# Patient Record
Sex: Female | Born: 1960
Health system: Southern US, Community
[De-identification: ages and names within clinical notes are randomized; demographics above are authoritative.]

## PROBLEM LIST (undated history)

## (undated) DIAGNOSIS — K219 Gastro-esophageal reflux disease without esophagitis: Secondary | ICD-10-CM

## (undated) DIAGNOSIS — J302 Other seasonal allergic rhinitis: Secondary | ICD-10-CM

## (undated) HISTORY — PX: TUBAL LIGATION: SHX77

## (undated) HISTORY — PX: COLONOSCOPY: SHX174

---

## 2005-04-17 ENCOUNTER — Ambulatory Visit: Payer: Self-pay | Admitting: Unknown Physician Specialty

## 2006-06-26 ENCOUNTER — Ambulatory Visit: Payer: Self-pay | Admitting: Unknown Physician Specialty

## 2010-12-07 ENCOUNTER — Ambulatory Visit: Payer: Self-pay | Admitting: Unknown Physician Specialty

## 2012-01-11 ENCOUNTER — Other Ambulatory Visit (HOSPITAL_COMMUNITY)
Admission: RE | Admit: 2012-01-11 | Discharge: 2012-01-11 | Disposition: A | Discharge status: Home / Self Care | Payer: 59 | Source: Ambulatory Visit | Attending: Family Medicine | Admitting: Family Medicine

## 2012-01-11 DIAGNOSIS — Z0000 Encounter for general adult medical examination without abnormal findings: Secondary | ICD-10-CM | POA: Insufficient documentation

## 2012-07-26 ENCOUNTER — Other Ambulatory Visit: Payer: Self-pay | Admitting: Obstetrics and Gynecology

## 2012-07-26 DIAGNOSIS — Z1231 Encounter for screening mammogram for malignant neoplasm of breast: Secondary | ICD-10-CM

## 2012-08-20 ENCOUNTER — Ambulatory Visit
Admission: RE | Admit: 2012-08-20 | Discharge: 2012-08-20 | Disposition: A | Discharge status: Home / Self Care | Payer: 59 | Source: Ambulatory Visit | Attending: Obstetrics and Gynecology | Admitting: Obstetrics and Gynecology

## 2012-08-20 DIAGNOSIS — Z1231 Encounter for screening mammogram for malignant neoplasm of breast: Secondary | ICD-10-CM

## 2012-08-29 ENCOUNTER — Other Ambulatory Visit: Payer: Self-pay | Admitting: Obstetrics and Gynecology

## 2012-08-29 DIAGNOSIS — R928 Other abnormal and inconclusive findings on diagnostic imaging of breast: Secondary | ICD-10-CM

## 2012-09-11 HISTORY — PX: ABLATION: SHX5711

## 2012-09-12 ENCOUNTER — Ambulatory Visit
Admission: RE | Admit: 2012-09-12 | Discharge: 2012-09-12 | Disposition: A | Discharge status: Home / Self Care | Payer: 59 | Source: Ambulatory Visit | Attending: Obstetrics and Gynecology | Admitting: Obstetrics and Gynecology

## 2012-09-12 DIAGNOSIS — R928 Other abnormal and inconclusive findings on diagnostic imaging of breast: Secondary | ICD-10-CM

## 2013-01-15 ENCOUNTER — Other Ambulatory Visit (HOSPITAL_COMMUNITY)
Admission: RE | Admit: 2013-01-15 | Discharge: 2013-01-15 | Disposition: A | Discharge status: Home / Self Care | Payer: 59 | Source: Ambulatory Visit | Attending: Obstetrics and Gynecology | Admitting: Obstetrics and Gynecology

## 2013-01-15 ENCOUNTER — Other Ambulatory Visit: Payer: Self-pay | Admitting: Obstetrics and Gynecology

## 2013-01-15 DIAGNOSIS — Z1151 Encounter for screening for human papillomavirus (HPV): Secondary | ICD-10-CM | POA: Insufficient documentation

## 2013-01-15 DIAGNOSIS — Z01419 Encounter for gynecological examination (general) (routine) without abnormal findings: Secondary | ICD-10-CM | POA: Insufficient documentation

## 2014-12-14 ENCOUNTER — Other Ambulatory Visit: Payer: Self-pay

## 2014-12-14 DIAGNOSIS — Z1231 Encounter for screening mammogram for malignant neoplasm of breast: Secondary | ICD-10-CM

## 2014-12-22 ENCOUNTER — Ambulatory Visit
Admission: RE | Admit: 2014-12-22 | Discharge: 2014-12-22 | Disposition: A | Discharge status: Home / Self Care | Payer: 59 | Source: Ambulatory Visit

## 2014-12-22 DIAGNOSIS — Z1231 Encounter for screening mammogram for malignant neoplasm of breast: Secondary | ICD-10-CM

## 2015-01-27 ENCOUNTER — Other Ambulatory Visit: Payer: Self-pay | Admitting: Obstetrics and Gynecology

## 2015-01-27 ENCOUNTER — Other Ambulatory Visit (HOSPITAL_COMMUNITY)
Admission: RE | Admit: 2015-01-27 | Discharge: 2015-01-27 | Disposition: A | Discharge status: Home / Self Care | Payer: 59 | Source: Ambulatory Visit | Attending: Obstetrics and Gynecology | Admitting: Obstetrics and Gynecology

## 2015-01-27 DIAGNOSIS — Z01419 Encounter for gynecological examination (general) (routine) without abnormal findings: Secondary | ICD-10-CM | POA: Diagnosis not present

## 2015-01-28 LAB — CYTOLOGY - PAP

## 2016-01-27 ENCOUNTER — Other Ambulatory Visit: Payer: Self-pay | Admitting: Obstetrics and Gynecology

## 2016-01-27 ENCOUNTER — Other Ambulatory Visit: Payer: Self-pay

## 2016-01-27 ENCOUNTER — Other Ambulatory Visit (HOSPITAL_COMMUNITY)
Admission: RE | Admit: 2016-01-27 | Discharge: 2016-01-27 | Disposition: A | Discharge status: Home / Self Care | Payer: 59 | Source: Ambulatory Visit | Attending: Obstetrics and Gynecology | Admitting: Obstetrics and Gynecology

## 2016-01-27 DIAGNOSIS — Z1151 Encounter for screening for human papillomavirus (HPV): Secondary | ICD-10-CM | POA: Diagnosis present

## 2016-01-27 DIAGNOSIS — R87810 Cervical high risk human papillomavirus (HPV) DNA test positive: Secondary | ICD-10-CM | POA: Diagnosis not present

## 2016-01-27 DIAGNOSIS — Z1231 Encounter for screening mammogram for malignant neoplasm of breast: Secondary | ICD-10-CM

## 2016-01-27 DIAGNOSIS — Z01419 Encounter for gynecological examination (general) (routine) without abnormal findings: Secondary | ICD-10-CM | POA: Insufficient documentation

## 2016-01-28 LAB — CYTOLOGY - PAP

## 2016-01-31 ENCOUNTER — Ambulatory Visit
Admission: RE | Admit: 2016-01-31 | Discharge: 2016-01-31 | Disposition: A | Discharge status: Home / Self Care | Payer: 59 | Source: Ambulatory Visit

## 2016-01-31 DIAGNOSIS — Z1231 Encounter for screening mammogram for malignant neoplasm of breast: Secondary | ICD-10-CM

## 2016-08-09 ENCOUNTER — Other Ambulatory Visit: Payer: Self-pay | Admitting: Orthopedic Surgery

## 2016-08-17 ENCOUNTER — Ambulatory Visit (HOSPITAL_BASED_OUTPATIENT_CLINIC_OR_DEPARTMENT_OTHER): Admission: RE | Admit: 2016-08-17 | Payer: 59 | Source: Ambulatory Visit | Admitting: Orthopedic Surgery

## 2016-08-17 ENCOUNTER — Encounter (HOSPITAL_BASED_OUTPATIENT_CLINIC_OR_DEPARTMENT_OTHER): Admission: RE | Payer: Self-pay | Source: Ambulatory Visit

## 2016-08-17 SURGERY — RELEASE, A1 PULLEY, FOR TRIGGER FINGER
Anesthesia: Choice | Laterality: Right

## 2017-01-29 ENCOUNTER — Other Ambulatory Visit (HOSPITAL_COMMUNITY)
Admission: RE | Admit: 2017-01-29 | Discharge: 2017-01-29 | Disposition: A | Discharge status: Home / Self Care | Payer: 59 | Source: Ambulatory Visit | Attending: Obstetrics and Gynecology | Admitting: Obstetrics and Gynecology

## 2017-01-29 ENCOUNTER — Other Ambulatory Visit: Payer: Self-pay | Admitting: Obstetrics and Gynecology

## 2017-01-29 DIAGNOSIS — Z124 Encounter for screening for malignant neoplasm of cervix: Secondary | ICD-10-CM | POA: Diagnosis not present

## 2017-01-29 DIAGNOSIS — R87810 Cervical high risk human papillomavirus (HPV) DNA test positive: Secondary | ICD-10-CM | POA: Insufficient documentation

## 2017-01-30 LAB — CYTOLOGY - PAP
Diagnosis: NEGATIVE
HPV (WINDOPATH): DETECTED — AB
HPV 16/18/45 genotyping: NEGATIVE

## 2017-02-28 ENCOUNTER — Other Ambulatory Visit: Payer: Self-pay | Admitting: Obstetrics and Gynecology

## 2017-03-06 ENCOUNTER — Other Ambulatory Visit: Payer: Self-pay | Admitting: Obstetrics and Gynecology

## 2017-03-06 DIAGNOSIS — Z1231 Encounter for screening mammogram for malignant neoplasm of breast: Secondary | ICD-10-CM

## 2017-03-16 ENCOUNTER — Ambulatory Visit: Payer: 59

## 2017-03-19 ENCOUNTER — Ambulatory Visit
Admission: RE | Admit: 2017-03-19 | Discharge: 2017-03-19 | Disposition: A | Discharge status: Home / Self Care | Payer: 59 | Source: Ambulatory Visit | Attending: Obstetrics and Gynecology | Admitting: Obstetrics and Gynecology

## 2017-03-19 DIAGNOSIS — Z1231 Encounter for screening mammogram for malignant neoplasm of breast: Secondary | ICD-10-CM

## 2017-05-07 ENCOUNTER — Encounter (HOSPITAL_COMMUNITY): Payer: Self-pay | Admitting: *Deleted

## 2017-05-23 ENCOUNTER — Encounter (HOSPITAL_COMMUNITY): Payer: Self-pay

## 2017-05-23 ENCOUNTER — Ambulatory Visit (HOSPITAL_COMMUNITY): Payer: 59 | Admitting: Anesthesiology

## 2017-05-23 ENCOUNTER — Encounter (HOSPITAL_COMMUNITY)
Admission: RE | Disposition: A | Discharge status: Home / Self Care | Payer: Self-pay | Source: Ambulatory Visit | Attending: Obstetrics and Gynecology

## 2017-05-23 ENCOUNTER — Ambulatory Visit (HOSPITAL_COMMUNITY)
Admission: RE | Admit: 2017-05-23 | Discharge: 2017-05-23 | Disposition: A | Discharge status: Home / Self Care | Payer: 59 | Source: Ambulatory Visit | Attending: Obstetrics and Gynecology | Admitting: Obstetrics and Gynecology

## 2017-05-23 DIAGNOSIS — D060 Carcinoma in situ of endocervix: Secondary | ICD-10-CM | POA: Insufficient documentation

## 2017-05-23 DIAGNOSIS — N871 Moderate cervical dysplasia: Secondary | ICD-10-CM | POA: Diagnosis present

## 2017-05-23 DIAGNOSIS — Z79899 Other long term (current) drug therapy: Secondary | ICD-10-CM | POA: Insufficient documentation

## 2017-05-23 DIAGNOSIS — Z9851 Tubal ligation status: Secondary | ICD-10-CM | POA: Diagnosis not present

## 2017-05-23 HISTORY — PX: CERVICAL CONIZATION W/BX: SHX1330

## 2017-05-23 LAB — CBC
HCT: 43.6 % (ref 36.0–46.0)
Hemoglobin: 14.7 g/dL (ref 12.0–15.0)
MCH: 30 pg (ref 26.0–34.0)
MCHC: 33.7 g/dL (ref 30.0–36.0)
MCV: 89 fL (ref 78.0–100.0)
PLATELETS: 280 10*3/uL (ref 150–400)
RBC: 4.9 MIL/uL (ref 3.87–5.11)
RDW: 13.2 % (ref 11.5–15.5)
WBC: 8.1 10*3/uL (ref 4.0–10.5)

## 2017-05-23 SURGERY — CONE BIOPSY, CERVIX
Anesthesia: Monitor Anesthesia Care | Site: Vagina

## 2017-05-23 MED ORDER — DEXAMETHASONE SODIUM PHOSPHATE 10 MG/ML IJ SOLN
INTRAMUSCULAR | Status: DC | PRN
  Administered 2017-05-23: 4 mg via INTRAVENOUS

## 2017-05-23 MED ORDER — LIDOCAINE HCL (CARDIAC) 20 MG/ML IV SOLN
INTRAVENOUS | Status: DC | PRN
  Administered 2017-05-23: 50 mg via INTRAVENOUS

## 2017-05-23 MED ORDER — LIDOCAINE-EPINEPHRINE 1 %-1:100000 IJ SOLN
INTRAMUSCULAR | Status: DC | PRN
  Administered 2017-05-23: 20 mL

## 2017-05-23 MED ORDER — HYDROCODONE-ACETAMINOPHEN 5-325 MG PO TABS: 1.0000 | ORAL_TABLET | Freq: Four times a day (QID) | ORAL | 0 refills | Status: DC | PRN

## 2017-05-23 MED ORDER — SCOPOLAMINE 1 MG/3DAYS TD PT72
MEDICATED_PATCH | TRANSDERMAL | Status: AC
  Filled 2017-05-23: qty 1

## 2017-05-23 MED ORDER — LIDOCAINE HCL (CARDIAC) 20 MG/ML IV SOLN
INTRAVENOUS | Status: AC
  Filled 2017-05-23: qty 5

## 2017-05-23 MED ORDER — IBUPROFEN 800 MG PO TABS: 800.0000 mg | ORAL_TABLET | Freq: Three times a day (TID) | ORAL | 0 refills | Status: DC | PRN

## 2017-05-23 MED ORDER — IODINE STRONG (LUGOLS) 5 % PO SOLN
ORAL | Status: DC | PRN
  Administered 2017-05-23: 0.2 mL

## 2017-05-23 MED ORDER — ONDANSETRON HCL 4 MG/2ML IJ SOLN
INTRAMUSCULAR | Status: AC
  Filled 2017-05-23: qty 2

## 2017-05-23 MED ORDER — FENTANYL CITRATE (PF) 100 MCG/2ML IJ SOLN
INTRAMUSCULAR | Status: DC | PRN
  Administered 2017-05-23: 100 ug via INTRAVENOUS

## 2017-05-23 MED ORDER — IODINE STRONG (LUGOLS) 5 % PO SOLN
ORAL | Status: AC
  Filled 2017-05-23: qty 1

## 2017-05-23 MED ORDER — LACTATED RINGERS IV SOLN
INTRAVENOUS | Status: DC
  Administered 2017-05-23 (×2): via INTRAVENOUS

## 2017-05-23 MED ORDER — PROPOFOL 10 MG/ML IV BOLUS
INTRAVENOUS | Status: AC
  Filled 2017-05-23: qty 20

## 2017-05-23 MED ORDER — PROPOFOL 10 MG/ML IV BOLUS
INTRAVENOUS | Status: DC | PRN
  Administered 2017-05-23 (×2): 50 mg via INTRAVENOUS
  Administered 2017-05-23: 20 mg via INTRAVENOUS
  Administered 2017-05-23: 30 mg via INTRAVENOUS
  Administered 2017-05-23 (×2): 50 mg via INTRAVENOUS

## 2017-05-23 MED ORDER — FENTANYL CITRATE (PF) 250 MCG/5ML IJ SOLN
INTRAMUSCULAR | Status: AC
  Filled 2017-05-23: qty 5

## 2017-05-23 MED ORDER — MIDAZOLAM HCL 2 MG/2ML IJ SOLN
INTRAMUSCULAR | Status: AC
  Filled 2017-05-23: qty 2

## 2017-05-23 MED ORDER — LIDOCAINE-EPINEPHRINE 1 %-1:100000 IJ SOLN
INTRAMUSCULAR | Status: AC
  Filled 2017-05-23: qty 1

## 2017-05-23 MED ORDER — MIDAZOLAM HCL 2 MG/2ML IJ SOLN
INTRAMUSCULAR | Status: DC | PRN
  Administered 2017-05-23: 2 mg via INTRAVENOUS

## 2017-05-23 MED ORDER — SCOPOLAMINE 1 MG/3DAYS TD PT72
1.0000 | MEDICATED_PATCH | Freq: Once | TRANSDERMAL | Status: DC
  Administered 2017-05-23: 1.5 mg via TRANSDERMAL

## 2017-05-23 MED ORDER — ONDANSETRON HCL 4 MG/2ML IJ SOLN
INTRAMUSCULAR | Status: DC | PRN
  Administered 2017-05-23: 4 mg via INTRAVENOUS

## 2017-05-23 MED ORDER — FERRIC SUBSULFATE 259 MG/GM EX SOLN
CUTANEOUS | Status: AC
  Filled 2017-05-23: qty 8

## 2017-05-23 MED ORDER — DEXAMETHASONE SODIUM PHOSPHATE 4 MG/ML IJ SOLN
INTRAMUSCULAR | Status: AC
Start: 2017-05-23 — End: ?
  Filled 2017-05-23: qty 1

## 2017-05-23 MED ORDER — ACETIC ACID 5 % SOLN
Status: AC
  Filled 2017-05-23: qty 500

## 2017-05-23 MED ORDER — KETOROLAC TROMETHAMINE 30 MG/ML IJ SOLN
INTRAMUSCULAR | Status: AC
  Filled 2017-05-23: qty 1

## 2017-05-23 MED ORDER — FERRIC SUBSULFATE SOLN
Status: DC | PRN
  Administered 2017-05-23: 1 via TOPICAL

## 2017-05-23 SURGICAL SUPPLY — 27 items
BLADE SURG 11 STRL SS (BLADE) ×2 IMPLANT
CATH ROBINSON RED A/P 16FR (CATHETERS) IMPLANT
CLOTH BEACON ORANGE TIMEOUT ST (SAFETY) ×2 IMPLANT
CONTAINER PREFILL 10% NBF 60ML (FORM) ×2 IMPLANT
COUNTER NEEDLE 1200 MAGNETIC (NEEDLE) ×2 IMPLANT
ELECT BALL LEEP 5MM RED (ELECTRODE) ×2 IMPLANT
ELECT REM PT RETURN 9FT ADLT (ELECTROSURGICAL) ×2
ELECTRODE REM PT RTRN 9FT ADLT (ELECTROSURGICAL) ×1 IMPLANT
GLOVE BIOGEL M 6.5 STRL (GLOVE) ×2 IMPLANT
GLOVE BIOGEL PI IND STRL 6.5 (GLOVE) ×1 IMPLANT
GLOVE BIOGEL PI IND STRL 7.0 (GLOVE) ×1 IMPLANT
GLOVE BIOGEL PI INDICATOR 6.5 (GLOVE) ×1
GLOVE BIOGEL PI INDICATOR 7.0 (GLOVE) ×1
GOWN STRL REUS W/TWL LRG LVL3 (GOWN DISPOSABLE) ×4 IMPLANT
HEMOSTAT SURGICEL 4X8 (HEMOSTASIS) ×2 IMPLANT
NS IRRIG 1000ML POUR BTL (IV SOLUTION) ×2 IMPLANT
PACK VAGINAL MINOR WOMEN LF (CUSTOM PROCEDURE TRAY) ×2 IMPLANT
PAD OB MATERNITY 4.3X12.25 (PERSONAL CARE ITEMS) ×2 IMPLANT
PENCIL BUTTON HOLSTER BLD 10FT (ELECTRODE) ×2 IMPLANT
SCOPETTES 8  STERILE (MISCELLANEOUS) ×1
SCOPETTES 8 STERILE (MISCELLANEOUS) ×1 IMPLANT
SPONGE SURGIFOAM ABS GEL 12-7 (HEMOSTASIS) IMPLANT
SUT VIC AB 0 CT1 27 (SUTURE) ×3
SUT VIC AB 0 CT1 27XBRD ANBCTR (SUTURE) ×3 IMPLANT
TOWEL OR 17X24 6PK STRL BLUE (TOWEL DISPOSABLE) ×4 IMPLANT
TUBING NON-CON 1/4 X 20 CONN (TUBING) ×2 IMPLANT
YANKAUER SUCT BULB TIP NO VENT (SUCTIONS) ×2 IMPLANT

## 2017-05-23 NOTE — Anesthesia Postprocedure Evaluation (Signed)
Anesthesia Post Note  Patient: Charlotte Ramirez  Procedure(s) Performed: Procedure(s) (LRB): CONIZATION CERVIX WITH BIOPSY (N/A)     Patient location during evaluation: PACU Anesthesia Type: MAC Level of consciousness: awake and alert Pain management: pain level controlled Vital Signs Assessment: post-procedure vital signs reviewed and stable Respiratory status: spontaneous breathing, nonlabored ventilation, respiratory function stable and patient connected to nasal cannula oxygen Cardiovascular status: stable and blood pressure returned to baseline Anesthetic complications: no    Last Vitals:  Vitals:   05/23/17 1617 05/23/17 1630  BP: 109/64 (!) 103/91  Pulse: 78 73  Resp: (!) 22 (!) 21  Temp: (!) 36.3 C   SpO2: 98% 100%    Last Pain:  Vitals:   05/23/17 1630  TempSrc:   PainSc: 0-No pain   Pain Goal: Patients Stated Pain Goal: 3 (05/23/17 1630)               Tiajuana Amass

## 2017-05-23 NOTE — H&P (Signed)
Chief Complaint(s):   High grade cervical dysplasia   HPI:  General 56 y/o presents for preop history and physical exam in preparation for Cold knife conization. Colposocpy results from 02/28/2017 [facility] CIN II biopsy at 12 oclock and 6 oclock CIN I. she had high risk hpv detected on pap smear in 2017 and 2018. Her pap smears in 2017 and 2018 were negative for intraepithelial lesions.  Current Medication:  Taking  Vitamin B12 100 MCG Tablet 1 tablet Orally Once a day     Cranberry 300 MG Tablet as directed Orally     Calcium 600 MG Tablet 2 tablets Orally Once a day     MVI 1 tab Oral     Vitamin D 1000 UNIT Tablet 1 tablet Orally Once a day     Biotin 300 MCG Tablet 1 tablet Orally Once a day     Zyrtec     Fish Oil 1000 MG Capsule 1 capsule Orally Once a day     Prevacid(Lansoprazole) 15 MG Capsule Delayed Release 1 capsule Orally Once a day     Medication List reviewed and reconciled with the patient   Medical History:   complex R ovarian cyst - 2013      Allergies/Intolerance:   N.K.D.A.   Gyn History:   Sexual activity currently sexually active. LMP 04/24/12. Birth control BTL, Novasure Ablation. Last pap smear date 01/29/17 - WNL high risk HPV (negative 16/18) 01/27/2016 Positive HPV. Last mammogram date 03/19/2017. Abnormal pap smear none. Denies STD none. GYN procedures 02/28/17 - Colposcopy 05/08/12 Novasure Ablation.   OB History:   Number of pregnancies 3. Pregnancy # 1 01/1985 , live birth, vaginal delivery, girl. Pregnancy # 2 01/1992 , live birth, vaginal delivery, boy. Pregnancy # 3 03/1994 , live birth, vaginal delivery, girl.   Surgical History:   tubal ligation     NovaSure Ablation in OBGYN office 05/08/12   Hospitalization:   child birth x 3   Family History:   Father: alive 26 yrs, health    Mother: alive 74 yrs, hypertension, diagnosed with Hypertension    Paternal Grand Father: deceased    Paternal Grand Mother: deceased, osteoporosis    Maternal Grand  Father: deceased    Maternal Grand Mother: deceased, breast cancer, diagnosed with Breast cancer    Brother 1: alive 47 yrs, obese    Sister 1: alive 89 yrs, healthy    Sister 2: alive 15 yrs, healthy    Sister 3: alive 21 yrs, healthy    Paternal aunt: deceased, Breast cancer, diagnosed with Breast cancer    1 brother(s) , 3 sister(s) . 1 son(s) , 2 daughter(s) .    Denies family hx colon cancer, colon polyps or liver disease.  Social History:  General Tobacco use cigarettes: Former smoker, Quit in year stopped age 37, Tobacco history last updated 05/16/2017.  no EXPOSURE TO PASSIVE SMOKE.  Alcohol: yes, Rare.  Caffeine: yes, Green Tea.  no Recreational drug use, in past.  Exercise: yes, 4x a week.  Marital Status: single, Divorced.  Children: 2, girls, 1, Boy.  OCCUPATION: employed, Scientist, research (physical sciences).  Seat belt use: yes.  ROS: Negative except as stated in HPI.   Objective: Vitals:  Wt 158, Wt change 1 lb, Ht 63.5, BMI 27.55, Pulse sitting 62, BP sitting 118/74  Past Results:  Examination:  General Examination alert, oriented, NAD " label="GENERAL APPEARANCE" categoryPropId="10089" examid="[id number]"GENERAL APPEARANCE alert, oriented, NAD .  moist, warm " label="SKIN:" categoryPropId="10109" examid="[id number]"SKIN: moist, warm .  Conjunctiva clear " label="EYES:" categoryPropId="21468" examid="[id number]"EYES: Conjunctiva clear .  clear to auscultation bilaterally" label="LUNGS:" categoryPropId="87" examid="[id number]"LUNGS: clear to auscultation bilaterally.  regular rate and rhythm" label="HEART:" categoryPropId="86" examid="[id number]"HEART: regular rate and rhythm.  soft, non-tender/non-distended, bowel sounds present " label="ABDOMEN:" categoryPropId="88" examid="[id number]"ABDOMEN: soft, non-tender/non-distended, bowel sounds present .  normal external genitalia, labia - unremarkable, vagina - pink moist mucosa, no lesions or abnormal discharge, cervix - no discharge or lesions  or CMT, adnexa - no masses or tenderness, uterus - nontender and normal size on palpation " label="FEMALE GENITOURINARY:" categoryPropId="13414" examid="[id number]"FEMALE GENITOURINARY: normal external genitalia, labia - unremarkable, vagina - pink moist mucosa, no lesions or abnormal discharge, cervix - no discharge or lesions or CMT, adnexa - no masses or tenderness, uterus - nontender and normal size on palpation .  affect normal, good eye contact " label="PSYCH:" categoryPropId="16316" examid="[id number]"PSYCH: affect normal, good eye contact .  Physical Examination:   Assessment: Assessment:  CIN II (cervical intraepithelial neoplasia II) - N87.1 (Primary)     Plan: Treatment:  CIN II (cervical intraepithelial neoplasia II)  Notes: given that this involves the Endocervical canal recommend cold knife conization. r/b/a were discussed with the patient including but not limited to infection/ bleeding damage to surrounding organs with the need for fuher surgery. risk of recurrence discussed. pt voided understanding and desires to proceed.

## 2017-05-23 NOTE — Op Note (Signed)
05/23/2017  4:23 PM  PATIENT:  Charlotte Ramirez  56 y.o. female  PRE-OPERATIVE DIAGNOSIS:  N87.1 Cervical Intra-epithelial Neoplasia II  POST-OPERATIVE DIAGNOSIS:  N87.1 Cervical Intra-epithelial Neoplasia II  PROCEDURE:  Procedure(s): CONIZATION CERVIX WITH BIOPSY (N/A)  SURGEON:  Surgeon(s) and Role:    Christophe Louis, MD - Primary  PHYSICIAN ASSISTANT:   ASSISTANTS: none   ANESTHESIA:   MAC  EBL:  Total I/O In: -  Out: 30 [Blood:30]  BLOOD ADMINISTERED:none  DRAINS: none   LOCAL MEDICATIONS USED:  XYLOCAINE   SPECIMEN:  Source of Specimen:  Cervical Cone Biopsy   DISPOSITION OF SPECIMEN:  PATHOLOGY  COUNTS:  YES  TOURNIQUET:  * No tourniquets in log *  DICTATION: .Dragon Dictation  PLAN OF CARE: Discharge to home after PACU  PATIENT DISPOSITION:  PACU - hemodynamically stable.   Delay start of Pharmacological VTE agent (>24hrs) due to surgical blood loss or risk of bleeding: not applicable  Findings: Normal vaginal mucosa. Hypopigmented area at 6 oclock with the application of lugols   Procedure. Pt was taken to the operating room where she was placed under general anesthesia. Time out was performed. She was prepped and draped in a sterile fashion. Speculum was placed. The anterior lip of the cervix was grasped with single toothed tenaculum.  10 cc of 1% lidocaine with epi was placed at the 4 and 8 o'clock positions. A suture of 0 vicryl was placed at the 3 and 9 o'clock positions and used for retraction. The single toothed tenaculum was removed. Lugol's  Was placed on the cervix with the findings noted above. Scalpel was used to excise a cone portion of the cervix. The specimen was marked with a suture at the 12 o'clock position and sent to pathology. Hemostasis was obtained with roller ball and Monsel. surgicel was placed in the wound bed.  Excellent hemostasis was noted.  Sponge lap and needle counts were correct x 2. Pt was awakened from anesthesia and taken to the  recovery room in stable condition.

## 2017-05-23 NOTE — Anesthesia Preprocedure Evaluation (Signed)
Anesthesia Evaluation  Patient identified by MRN, date of birth, ID band Patient awake    Reviewed: Allergy & Precautions, NPO status , Patient's Chart, lab work & pertinent test results  Airway Mallampati: II  TM Distance: >3 FB Neck ROM: Full    Dental  (+) Dental Advisory Given   Pulmonary neg pulmonary ROS,    breath sounds clear to auscultation       Cardiovascular negative cardio ROS   Rhythm:Regular Rate:Normal     Neuro/Psych negative neurological ROS     GI/Hepatic negative GI ROS, Neg liver ROS,   Endo/Other  negative endocrine ROS  Renal/GU negative Renal ROS     Musculoskeletal   Abdominal   Peds  Hematology negative hematology ROS (+)   Anesthesia Other Findings   Reproductive/Obstetrics                             Lab Results  Component Value Date   WBC 8.1 05/23/2017   HGB 14.7 05/23/2017   HCT 43.6 05/23/2017   MCV 89.0 05/23/2017   PLT 280 05/23/2017   No results found for: CREATININE, BUN, NA, K, CL, CO2  Anesthesia Physical Anesthesia Plan  ASA: II  Anesthesia Plan: MAC   Post-op Pain Management:    Induction: Intravenous  PONV Risk Score and Plan: 3 and Ondansetron, Dexamethasone and Propofol infusion  Airway Management Planned: Natural Airway and Simple Face Mask  Additional Equipment:   Intra-op Plan:   Post-operative Plan:   Informed Consent: I have reviewed the patients History and Physical, chart, labs and discussed the procedure including the risks, benefits and alternatives for the proposed anesthesia with the patient or authorized representative who has indicated his/her understanding and acceptance.     Plan Discussed with: CRNA  Anesthesia Plan Comments:         Anesthesia Quick Evaluation

## 2017-05-23 NOTE — Discharge Instructions (Addendum)
°  Post Anesthesia Home Care Instructions  NO IBUPROFEN PRODUCTS UNTIL: 8:00 pm  Activity: Get plenty of rest for the remainder of the day. A responsible individual must stay with you for 24 hours following the procedure.  For the next 24 hours, DO NOT: -Drive a car -Advertising copywriterperate machinery -Drink alcoholic beverages -Take any medication unless instructed by your physician -Make any legal decisions or sign important papers.  Meals: Start with liquid foods such as gelatin or soup. Progress to regular foods as tolerated. Avoid greasy, spicy, heavy foods. If nausea and/or vomiting occur, drink only clear liquids until the nausea and/or vomiting subsides. Call your physician if vomiting continues.  Special Instructions/Symptoms: Your throat may feel dry or sore from the anesthesia or the breathing tube placed in your throat during surgery. If this causes discomfort, gargle with warm salt water. The discomfort should disappear within 24 hours.  If you had a scopolamine patch placed behind your ear for the management of post- operative nausea and/or vomiting:  1. The medication in the patch is effective for 72 hours, after which it should be removed.  Wrap patch in a tissue and discard in the trash. Wash hands thoroughly with soap and water. 2. You may remove the patch earlier than 72 hours if you experience unpleasant side effects which may include dry mouth, dizziness or visual disturbances. 3. Avoid touching the patch. Wash your hands with soap and water after contact with the patch.

## 2017-05-23 NOTE — Transfer of Care (Signed)
Immediate Anesthesia Transfer of Care Note  Patient: Charlotte Ramirez  Procedure(s) Performed: Procedure(s): CONIZATION CERVIX WITH BIOPSY (N/A)  Patient Location: PACU  Anesthesia Type:MAC  Level of Consciousness: awake, alert  and oriented  Airway & Oxygen Therapy: Patient Spontanous Breathing  Post-op Assessment: Report given to RN and Post -op Vital signs reviewed and stable  Post vital signs: Reviewed and stable  Last Vitals:  Vitals:   05/23/17 1315  BP: 120/75  Pulse: 64  Resp: 16  Temp: 36.7 C  SpO2: 100%    Last Pain:  Vitals:   05/23/17 1315  TempSrc: Oral      Patients Stated Pain Goal: 3 (34/35/68 6168)  Complications: No apparent anesthesia complications

## 2017-05-24 ENCOUNTER — Encounter (HOSPITAL_COMMUNITY): Payer: Self-pay | Admitting: Obstetrics and Gynecology

## 2017-09-19 DIAGNOSIS — J018 Other acute sinusitis: Secondary | ICD-10-CM | POA: Diagnosis not present

## 2017-10-18 ENCOUNTER — Other Ambulatory Visit: Payer: Self-pay | Admitting: Obstetrics and Gynecology

## 2017-10-18 ENCOUNTER — Other Ambulatory Visit (HOSPITAL_COMMUNITY)
Admission: RE | Admit: 2017-10-18 | Discharge: 2017-10-18 | Disposition: A | Discharge status: Home / Self Care | Payer: BLUE CROSS/BLUE SHIELD | Source: Ambulatory Visit | Attending: Obstetrics and Gynecology | Admitting: Obstetrics and Gynecology

## 2017-10-18 DIAGNOSIS — Z124 Encounter for screening for malignant neoplasm of cervix: Secondary | ICD-10-CM | POA: Diagnosis not present

## 2017-10-18 DIAGNOSIS — R87610 Atypical squamous cells of undetermined significance on cytologic smear of cervix (ASC-US): Secondary | ICD-10-CM | POA: Diagnosis not present

## 2017-10-18 DIAGNOSIS — D069 Carcinoma in situ of cervix, unspecified: Secondary | ICD-10-CM | POA: Diagnosis not present

## 2017-10-23 LAB — CYTOLOGY - PAP
Diagnosis: UNDETERMINED — AB
HPV: DETECTED — AB

## 2017-11-26 DIAGNOSIS — Z136 Encounter for screening for cardiovascular disorders: Secondary | ICD-10-CM | POA: Diagnosis not present

## 2017-11-26 DIAGNOSIS — R140 Abdominal distension (gaseous): Secondary | ICD-10-CM | POA: Diagnosis not present

## 2017-11-26 DIAGNOSIS — Z131 Encounter for screening for diabetes mellitus: Secondary | ICD-10-CM | POA: Diagnosis not present

## 2017-11-26 DIAGNOSIS — Z0000 Encounter for general adult medical examination without abnormal findings: Secondary | ICD-10-CM | POA: Diagnosis not present

## 2017-11-29 ENCOUNTER — Other Ambulatory Visit: Payer: Self-pay | Admitting: Obstetrics and Gynecology

## 2017-11-29 DIAGNOSIS — N72 Inflammatory disease of cervix uteri: Secondary | ICD-10-CM | POA: Diagnosis not present

## 2017-11-29 DIAGNOSIS — N870 Mild cervical dysplasia: Secondary | ICD-10-CM | POA: Diagnosis not present

## 2017-12-19 DIAGNOSIS — N871 Moderate cervical dysplasia: Secondary | ICD-10-CM | POA: Diagnosis not present

## 2017-12-27 NOTE — Patient Instructions (Addendum)
Your procedure is scheduled on: Wednesday January 02, 2018 at 2:15 pm  Enter through the Main [facility]trance of [facility] at: 12:45  Pick up the phone at the desk and dial [phone].  Call this number if you have problems the morning of surgery: [phone].  Remember: Do NOT eat food: after Midnight on Tuesday April 23 Do NOT drink clear liquids after: 8:00 am Take these medicines the morning of surgery with a SIP OF WATER: Zyrtec, Zantac  STOP ALL VITAMINS AND SUPPLEMENTS 1 WEEK PRIOR TO SURGERY  DO NOT SMOKE DAY OF SURGERY  Do NOT wear jewelry (body piercing), metal hair clips/bobby pins, make-up, or nail polish. Do NOT wear lotions, powders, or perfumes.  You may wear deoderant. Do NOT shave for 48 hours prior to surgery. Do NOT bring valuables to the hospital. Contacts, dentures, or bridgework may not be worn into surgery. Leave suitcase in car.  After surgery it may be brought to your room.    For patients admitted to the hospital, checkout time is 11:00 AM the day of discharge.

## 2017-12-31 ENCOUNTER — Other Ambulatory Visit: Payer: Self-pay

## 2017-12-31 ENCOUNTER — Encounter (HOSPITAL_COMMUNITY)
Admission: RE | Admit: 2017-12-31 | Discharge: 2017-12-31 | Disposition: A | Discharge status: Home / Self Care | Payer: BLUE CROSS/BLUE SHIELD | Source: Ambulatory Visit | Attending: Obstetrics and Gynecology | Admitting: Obstetrics and Gynecology

## 2017-12-31 ENCOUNTER — Encounter (HOSPITAL_COMMUNITY): Payer: Self-pay

## 2017-12-31 ENCOUNTER — Other Ambulatory Visit: Payer: Self-pay | Admitting: Obstetrics and Gynecology

## 2017-12-31 DIAGNOSIS — Z01812 Encounter for preprocedural laboratory examination: Secondary | ICD-10-CM | POA: Diagnosis not present

## 2017-12-31 LAB — CBC
HCT: 42 % (ref 36.0–46.0)
Hemoglobin: 13.8 g/dL (ref 12.0–15.0)
MCH: 29.4 pg (ref 26.0–34.0)
MCHC: 32.9 g/dL (ref 30.0–36.0)
MCV: 89.4 fL (ref 78.0–100.0)
PLATELETS: 288 10*3/uL (ref 150–400)
RBC: 4.7 MIL/uL (ref 3.87–5.11)
RDW: 13.6 % (ref 11.5–15.5)
WBC: 9 10*3/uL (ref 4.0–10.5)

## 2017-12-31 NOTE — H&P (Signed)
Subjective: Chief Complaint(s):   pre-op history and phyical exam.   HPI:  General 57 y/o presents for preop history and physical in preparation for laparsocpic assisted vaginal hysterectomy with bilateral salpingectomy due to recurrent cervical dysplasia.  Pertinent history: she had high risk hpv detected on pap smear in 2017 and 2018. Her pap smears in 2017 and 2018 were negative for intraepithelial lesions. Colposocpy results from 02/28/2017 [facility] CIN II biopsy at 12 oclock and 6 oclock CIN I.s/p Cold knife conization 05/23/2017. Pathology revealed CIN III margins were negative. 4 month surveillance pap smear 10/18/2017 was ASCUS with high risk hpv detected.  Colpo 11/29/2017 - CIN I and II. Current Medication: Taking  Vitamin B12 100 MCG Tablet 1 tablet Orally Once a day     Cranberry 300 MG Tablet as directed Orally     Calcium 600 MG Tablet 2 tablets Orally Once a day     MVI 1 tab Oral     Vitamin D 1000 UNIT Tablet 1 tablet Orally Once a day     Biotin 300 MCG Tablet 1 tablet Orally Once a day     Zyrtec     Fish Oil 1000 MG Capsule 1 capsule Orally Once a day     Zantac(RaNITidine HCl) 150 MG Capsule 1 capsule at bedtime Orally Once a day     Medication List reviewed and reconciled with the patient   Medical History:  complex R ovarian cyst - 2013     CIN3 - s/p CKC      Allergies/Intolerance:  N.K.D.A.   Gyn History:  Sexual activity currently sexually active. LMP 04/24/12. Birth control BTL, Novasure Ablation. Last pap smear date 10/18/17. Last mammogram date 03/19/2017. Abnormal pap smear none. Denies STD none. GYN procedures 02/28/17 - Colposcopy 05/08/12 Novasure Ablation.   OB History:  Number of pregnancies 3. Pregnancy # 1 01/1985 , live birth, vaginal delivery, girl. Pregnancy # 2 01/1992 , live birth, vaginal delivery, boy. Pregnancy # 3 03/1994 , live birth, vaginal delivery, girl.   Surgical History:  tubal ligation     NovaSure Ablation in OBGYN office 05/08/12       CKC for high grade cervical dysplasia 2018   Hospitalization:  child birth x 3   Family History:  Father: alive 3784 yrs, health    Mother: alive 7780 yrs, hypertension, diagnosed with Hypertension    Paternal Grand Father: deceased    Paternal Grand Mother: deceased, osteoporosis    Maternal Grand Father: deceased    Maternal Grand Mother: deceased, breast cancer, Breast cancer    Brother 1: alive 5247 yrs, obese    Sister 1: alive 7655 yrs, healthy    Sister 2: alive 6953 yrs, healthy    Sister 3: alive 7043 yrs, healthy    Paternal aunt: deceased, Breast cancer, Breast cancer    1 brother(s) , 3 sister(s) . 1 son(s) , 2 daughter(s) .    Denies family hx colon cancer, colon polyps or liver disease.  Social History: General Tobacco use cigarettes: Former smoker, Quit in year stopped age 57, Tobacco history last updated 12/19/2017.  no EXPOSURE TO PASSIVE SMOKE.  Alcohol: yes, Rare.  Caffeine: yes, Green Tea.  no Recreational drug use, in past.  Exercise: yes, 4x a week.  Marital Status: single, Divorced.  Children: 2, girls, 1, Boy.  OCCUPATION: employed, Scientist, research (physical sciences)senior technical recruiter.  Seat belt use: yes.   ROS: CONSTITUTIONAL none" options="no,yes" propid="91" itemid="[id number]" categoryid="10464" encounterid="[id number]"Fatigue none. none today" options="no,yes" propid="91"  itemid="10467" categoryid="10464" encounterid="[id number]"Fever none today.  CARDIOLOGY none" options="no,yes" propid="91" itemid="193603" categoryid="10488" encounterid="[id number]"Chest pain none.  RESPIRATORY no" options="no" propid="91" itemid="270013" categoryid="138132" encounterid="[id number]"Shortness of breath no. no" options="no,yes" propid="91" itemid="172745" categoryid="138132" encounterid="[id number]"Cough no.  GASTROENTEROLOGY none" options="no,yes" propid="91" itemid="193447" categoryid="10494" encounterid="[id number]"Appetite change none. no" options="no,yes" propid="91" itemid="193449"  categoryid="10494" encounterid="[id number]"Change in bowel habits no.  FEMALE REPRODUCTIVE no" options="no,yes" propid="91" itemid="196298" categoryid="10525" encounterid="[id number]"Breast lumps or discharge no. none" options="no,yes" propid="91" itemid="186083" categoryid="10525" encounterid="[id number]"Breast pain none. none" options="no,yes" propid="91" itemid="138198" categoryid="10525" encounterid="[id number]"Dyspareunia none. no" options="no,yes" propid="91" itemid="202654" categoryid="10525" encounterid="[id number]"Dysuria no. none" options="no,yes" propid="91" itemid="186082" categoryid="10525" encounterid="[id number]"Pelvic pain none. yes" options="no,yes" propid="91" itemid="199173" categoryid="10525" encounterid="[id number]"Regular menses yes. no" options="no,yes" propid="91" itemid="278230" categoryid="10525" encounterid="[id number]"Unusual vaginal discharge no. no" options="no,yes" propid="91" itemid="278942" categoryid="10525" encounterid="[id number]"Vaginal itching no. no" options="no,yes" propid="91" itemid="278837" categoryid="10525" encounterid="[id number]"Vulvar/labial lesion no.  NEUROLOGY none" options="no,yes" propid="91" itemid="193627" categoryid="12512" encounterid="[id number]"Migraines none. none" options="no,yes" propid="91" itemid="12514" categoryid="12512" encounterid="[id number]"Tingling/numbness none. none" options="no,yes" propid="91" itemid="193467" categoryid="12512" encounterid="[id number]"Visual changes none.  PSYCHOLOGY no" options="" propid="91" itemid="275919" categoryid="10520" encounterid="[id number]"Depression no.  SKIN no" options="no,yes" propid="91" itemid="269383" categoryid="202750" encounterid="[id number]"Rash no. no" options="no,yes" propid="91" itemid="202757" categoryid="202750" encounterid="[id number]"Suspicious lesions no.  ENDOCRINOLOGY none" options="no,yes" propid="91" itemid="202624" categoryid="12508" encounterid="[id number]"Hot flashes none. no unintentional" options="no,yes"  propid="91" itemid="193436" categoryid="12508" encounterid="[id number]"Weight gain no unintentional. none" options="no,yes" propid="91" itemid="138164" categoryid="12508" encounterid="[id number]"Weight loss none.  HEMATOLOGY/LYMPH no" options="no,yes" propid="91" itemid="193454" categoryid="138157" encounterid="[id number]"Anemia no.     Objective: Vitals: Wt 169, Ht 64, BMI 29.01, Temp 98.3, Pulse sitting 60, BP sitting 118/72  Past Results: Examination:  Physical Examination: GENERAL in NAD, pleasant"Patient appears in NAD, pleasant. well developed"Build: well developed. overweight"General Appearance: overweight. caucasian"Race: caucasian.  LUNGS clear to auscultation"Breath sounds: clear to auscultation. no"Dyspnea: no.  HEART none"Murmurs: none. normal"Rate: normal. regular"Rhythm: regular.  ABDOMEN no masses,tenderness,organomegaly, no CVAT"General: no masses,tenderness,organomegaly, no CVAT.  FEMALE GENITOURINARY no mass, non tender"Adnexa: no mass, non tender. normal, no lesions"Anus/perineum: normal, no lesions. flush with vaginal mucosa "Cervix/ cuff: flush with vaginal mucosa . normal, no lesions, no skin discoloration, no lymphadenopathy"External genitalia: normal, no lesions, no skin discoloration, no lymphadenopathy. deferred"Rectum: deferred. normal external meatus"Urethra: normal external meatus. normal size/shape/consistency, freely mobile, non tender"Uterus: normal size/shape/consistency, freely mobile, non tender. pink/moist mucosa, no lesions, no abnormal discharge, odorless"Vagina: pink/moist mucosa, no lesions, no abnormal discharge, odorless. normal, no lesions, no skin discoloration, non tender"Vulva: normal, no lesions, no skin discoloration, non tender.  EXTREMITIES FROM of all extremities"Extremities FROM of all extremities.  NEUROLOGICAL normal"Gait: normal. alert and oriented x 3"Orientation: alert and oriented x 3.     Assessment: Assessment:  CIN II (cervical  intraepithelial neoplasia II) - N87.1 (Primary)     Plan: Treatment: CIN II (cervical intraepithelial neoplasia II) Notes: pt has h/o cold knife conization. given recurrent cervical dysplasia.Marland Kitchen pt desires definitive therapy via hysterectomy.. r/b/a of hysterectomy discussed including but not limited to infection. bleeding, damage to bowel bladder and ureters with the need for further surgery. r/o conversion to laparotomy discussed. pt voiced understanding and desires to proceed.

## 2018-01-02 ENCOUNTER — Encounter (HOSPITAL_COMMUNITY)
Admission: AD | Disposition: A | Discharge status: Home / Self Care | Payer: Self-pay | Source: Ambulatory Visit | Attending: Obstetrics and Gynecology

## 2018-01-02 ENCOUNTER — Other Ambulatory Visit: Payer: Self-pay

## 2018-01-02 ENCOUNTER — Ambulatory Visit (HOSPITAL_COMMUNITY): Payer: BLUE CROSS/BLUE SHIELD | Admitting: Anesthesiology

## 2018-01-02 ENCOUNTER — Inpatient Hospital Stay (HOSPITAL_COMMUNITY)
Admission: AD | Admit: 2018-01-02 | Discharge: 2018-01-03 | DRG: 743 | Disposition: A | Discharge status: Home / Self Care | Payer: BLUE CROSS/BLUE SHIELD | Source: Ambulatory Visit | Attending: Obstetrics and Gynecology | Admitting: Obstetrics and Gynecology

## 2018-01-02 ENCOUNTER — Encounter (HOSPITAL_COMMUNITY): Payer: Self-pay | Admitting: *Deleted

## 2018-01-02 DIAGNOSIS — N952 Postmenopausal atrophic vaginitis: Secondary | ICD-10-CM | POA: Diagnosis present

## 2018-01-02 DIAGNOSIS — R87810 Cervical high risk human papillomavirus (HPV) DNA test positive: Secondary | ICD-10-CM | POA: Diagnosis not present

## 2018-01-02 DIAGNOSIS — M4802 Spinal stenosis, cervical region: Secondary | ICD-10-CM | POA: Diagnosis present

## 2018-01-02 DIAGNOSIS — Q519 Congenital malformation of uterus and cervix, unspecified: Secondary | ICD-10-CM

## 2018-01-02 DIAGNOSIS — D069 Carcinoma in situ of cervix, unspecified: Secondary | ICD-10-CM | POA: Diagnosis not present

## 2018-01-02 DIAGNOSIS — N871 Moderate cervical dysplasia: Principal | ICD-10-CM | POA: Diagnosis present

## 2018-01-02 DIAGNOSIS — Z90710 Acquired absence of both cervix and uterus: Secondary | ICD-10-CM | POA: Diagnosis present

## 2018-01-02 DIAGNOSIS — N870 Mild cervical dysplasia: Secondary | ICD-10-CM | POA: Diagnosis not present

## 2018-01-02 HISTORY — DX: Gastro-esophageal reflux disease without esophagitis: K21.9

## 2018-01-02 HISTORY — PX: HYSTERECTOMY ABDOMINAL WITH SALPINGECTOMY: SHX6725

## 2018-01-02 HISTORY — DX: Other seasonal allergic rhinitis: J30.2

## 2018-01-02 LAB — TYPE AND SCREEN
ABO/RH(D): A POS
ANTIBODY SCREEN: NEGATIVE

## 2018-01-02 LAB — ABO/RH: ABO/RH(D): A POS

## 2018-01-02 SURGERY — HYSTERECTOMY, TOTAL, ABDOMINAL, WITH SALPINGECTOMY
Anesthesia: General | Site: Abdomen | Laterality: Bilateral

## 2018-01-02 MED ORDER — ONDANSETRON HCL 4 MG/2ML IJ SOLN
INTRAMUSCULAR | Status: DC | PRN
  Administered 2018-01-02: 4 mg via INTRAVENOUS

## 2018-01-02 MED ORDER — ROCURONIUM BROMIDE 100 MG/10ML IV SOLN
INTRAVENOUS | Status: AC
  Filled 2018-01-02: qty 1

## 2018-01-02 MED ORDER — SIMETHICONE 80 MG PO CHEW: 80.0000 mg | CHEWABLE_TABLET | Freq: Four times a day (QID) | ORAL | Status: DC | PRN

## 2018-01-02 MED ORDER — ONDANSETRON HCL 4 MG PO TABS: 4.0000 mg | ORAL_TABLET | Freq: Four times a day (QID) | ORAL | Status: DC | PRN

## 2018-01-02 MED ORDER — ONDANSETRON HCL 4 MG/2ML IJ SOLN: 4.0000 mg | Freq: Four times a day (QID) | INTRAMUSCULAR | Status: DC | PRN

## 2018-01-02 MED ORDER — OXYCODONE-ACETAMINOPHEN 5-325 MG PO TABS: 2.0000 | ORAL_TABLET | ORAL | Status: DC | PRN

## 2018-01-02 MED ORDER — ACETAMINOPHEN 500 MG PO TABS
ORAL_TABLET | ORAL | Status: AC
  Filled 2018-01-02: qty 2

## 2018-01-02 MED ORDER — SODIUM CHLORIDE 0.9% FLUSH: 9.0000 mL | INTRAVENOUS | Status: DC | PRN

## 2018-01-02 MED ORDER — GLYCOPYRROLATE 0.2 MG/ML IJ SOLN
INTRAMUSCULAR | Status: AC
  Filled 2018-01-02: qty 1

## 2018-01-02 MED ORDER — DEXAMETHASONE SODIUM PHOSPHATE 4 MG/ML IJ SOLN
INTRAMUSCULAR | Status: AC
  Filled 2018-01-02: qty 1

## 2018-01-02 MED ORDER — FENTANYL CITRATE (PF) 250 MCG/5ML IJ SOLN
INTRAMUSCULAR | Status: AC
Start: 2018-01-02 — End: 2018-01-02
  Filled 2018-01-02: qty 5

## 2018-01-02 MED ORDER — ALUM & MAG HYDROXIDE-SIMETH 200-200-20 MG/5ML PO SUSP: 30.0000 mL | ORAL | Status: DC | PRN

## 2018-01-02 MED ORDER — SUGAMMADEX SODIUM 200 MG/2ML IV SOLN
INTRAVENOUS | Status: AC
  Filled 2018-01-02: qty 2

## 2018-01-02 MED ORDER — MIDAZOLAM HCL 2 MG/2ML IJ SOLN
INTRAMUSCULAR | Status: AC
  Filled 2018-01-02: qty 2

## 2018-01-02 MED ORDER — MIDAZOLAM HCL 2 MG/2ML IJ SOLN
INTRAMUSCULAR | Status: DC | PRN
  Administered 2018-01-02: 2 mg via INTRAVENOUS

## 2018-01-02 MED ORDER — FENTANYL CITRATE (PF) 100 MCG/2ML IJ SOLN
INTRAMUSCULAR | Status: AC
  Administered 2018-01-02: 50 ug via INTRAVENOUS
  Filled 2018-01-02: qty 2

## 2018-01-02 MED ORDER — PROMETHAZINE HCL 25 MG/ML IJ SOLN: 6.2500 mg | INTRAMUSCULAR | Status: DC | PRN

## 2018-01-02 MED ORDER — TEMAZEPAM 15 MG PO CAPS
15.0000 mg | ORAL_CAPSULE | Freq: Every evening | ORAL | Status: DC | PRN
  Filled 2018-01-02: qty 2

## 2018-01-02 MED ORDER — MENTHOL 3 MG MT LOZG: 1.0000 | LOZENGE | OROMUCOSAL | Status: DC | PRN

## 2018-01-02 MED ORDER — CEFAZOLIN SODIUM-DEXTROSE 2-4 GM/100ML-% IV SOLN
2.0000 g | INTRAVENOUS | Status: AC
  Administered 2018-01-02: 2 g via INTRAVENOUS

## 2018-01-02 MED ORDER — DIPHENHYDRAMINE HCL 12.5 MG/5ML PO ELIX: 12.5000 mg | ORAL_SOLUTION | Freq: Four times a day (QID) | ORAL | Status: DC | PRN

## 2018-01-02 MED ORDER — BUPIVACAINE HCL (PF) 0.25 % IJ SOLN
INTRAMUSCULAR | Status: DC | PRN
  Administered 2018-01-02: 30 mL

## 2018-01-02 MED ORDER — KETOROLAC TROMETHAMINE 30 MG/ML IJ SOLN
INTRAMUSCULAR | Status: AC
  Filled 2018-01-02: qty 1

## 2018-01-02 MED ORDER — EPHEDRINE SULFATE 50 MG/ML IJ SOLN
INTRAMUSCULAR | Status: DC | PRN
  Administered 2018-01-02 (×2): 10 mg via INTRAVENOUS

## 2018-01-02 MED ORDER — SCOPOLAMINE 1 MG/3DAYS TD PT72
1.0000 | MEDICATED_PATCH | Freq: Once | TRANSDERMAL | Status: DC
  Administered 2018-01-02: 1.5 mg via TRANSDERMAL

## 2018-01-02 MED ORDER — PROPOFOL 10 MG/ML IV BOLUS
INTRAVENOUS | Status: AC
  Filled 2018-01-02: qty 20

## 2018-01-02 MED ORDER — GABAPENTIN 300 MG PO CAPS
300.0000 mg | ORAL_CAPSULE | Freq: Once | ORAL | Status: AC
  Administered 2018-01-02: 300 mg via ORAL

## 2018-01-02 MED ORDER — LORATADINE 10 MG PO TABS
10.0000 mg | ORAL_TABLET | Freq: Every day | ORAL | Status: DC
  Filled 2018-01-02: qty 1

## 2018-01-02 MED ORDER — SCOPOLAMINE 1 MG/3DAYS TD PT72
MEDICATED_PATCH | TRANSDERMAL | Status: AC
  Filled 2018-01-02: qty 1

## 2018-01-02 MED ORDER — SUGAMMADEX SODIUM 200 MG/2ML IV SOLN
INTRAVENOUS | Status: DC | PRN
  Administered 2018-01-02: 200 mg via INTRAVENOUS

## 2018-01-02 MED ORDER — GLYCOPYRROLATE 0.2 MG/ML IJ SOLN
INTRAMUSCULAR | Status: DC | PRN
  Administered 2018-01-02: .2 mg via INTRAVENOUS

## 2018-01-02 MED ORDER — ONDANSETRON HCL 4 MG/2ML IJ SOLN
INTRAMUSCULAR | Status: AC
  Filled 2018-01-02: qty 2

## 2018-01-02 MED ORDER — LIDOCAINE HCL (CARDIAC) PF 100 MG/5ML IV SOSY
PREFILLED_SYRINGE | INTRAVENOUS | Status: DC | PRN
  Administered 2018-01-02: 50 mg via INTRAVENOUS

## 2018-01-02 MED ORDER — KETOROLAC TROMETHAMINE 30 MG/ML IJ SOLN
INTRAMUSCULAR | Status: DC | PRN
  Administered 2018-01-02: 30 mg via INTRAVENOUS

## 2018-01-02 MED ORDER — BUPIVACAINE HCL (PF) 0.25 % IJ SOLN
INTRAMUSCULAR | Status: AC
  Filled 2018-01-02: qty 30

## 2018-01-02 MED ORDER — KETOROLAC TROMETHAMINE 30 MG/ML IJ SOLN
30.0000 mg | Freq: Four times a day (QID) | INTRAMUSCULAR | Status: DC
  Administered 2018-01-02 – 2018-01-03 (×2): 30 mg via INTRAVENOUS
  Filled 2018-01-02 (×2): qty 1

## 2018-01-02 MED ORDER — OXYCODONE-ACETAMINOPHEN 5-325 MG PO TABS: 1.0000 | ORAL_TABLET | ORAL | Status: DC | PRN

## 2018-01-02 MED ORDER — KETOROLAC TROMETHAMINE 30 MG/ML IJ SOLN: 30.0000 mg | Freq: Four times a day (QID) | INTRAMUSCULAR | Status: DC

## 2018-01-02 MED ORDER — IBUPROFEN 800 MG PO TABS: 800.0000 mg | ORAL_TABLET | Freq: Three times a day (TID) | ORAL | Status: DC | PRN

## 2018-01-02 MED ORDER — FENTANYL CITRATE (PF) 100 MCG/2ML IJ SOLN
25.0000 ug | INTRAMUSCULAR | Status: DC | PRN
  Administered 2018-01-02: 50 ug via INTRAVENOUS

## 2018-01-02 MED ORDER — DEXAMETHASONE SODIUM PHOSPHATE 10 MG/ML IJ SOLN
INTRAMUSCULAR | Status: DC | PRN
  Administered 2018-01-02: 10 mg via INTRAVENOUS

## 2018-01-02 MED ORDER — PROPOFOL 10 MG/ML IV BOLUS
INTRAVENOUS | Status: DC | PRN
  Administered 2018-01-02: 160 mg via INTRAVENOUS
  Administered 2018-01-02: 40 mg via INTRAVENOUS

## 2018-01-02 MED ORDER — FENTANYL CITRATE (PF) 100 MCG/2ML IJ SOLN
INTRAMUSCULAR | Status: DC | PRN
  Administered 2018-01-02: 100 ug via INTRAVENOUS
  Administered 2018-01-02: 50 ug via INTRAVENOUS
  Administered 2018-01-02: 100 ug via INTRAVENOUS

## 2018-01-02 MED ORDER — SENNA 8.6 MG PO TABS
1.0000 | ORAL_TABLET | Freq: Two times a day (BID) | ORAL | Status: DC
  Administered 2018-01-02: 8.6 mg via ORAL
  Filled 2018-01-02 (×2): qty 1

## 2018-01-02 MED ORDER — ROCURONIUM BROMIDE 100 MG/10ML IV SOLN
INTRAVENOUS | Status: DC | PRN
  Administered 2018-01-02: 40 mg via INTRAVENOUS
  Administered 2018-01-02: 10 mg via INTRAVENOUS
  Administered 2018-01-02: 5 mg via INTRAVENOUS

## 2018-01-02 MED ORDER — EPHEDRINE 5 MG/ML INJ
INTRAVENOUS | Status: AC
  Filled 2018-01-02: qty 10

## 2018-01-02 MED ORDER — LIDOCAINE HCL (CARDIAC) PF 100 MG/5ML IV SOSY
PREFILLED_SYRINGE | INTRAVENOUS | Status: AC
  Filled 2018-01-02: qty 5

## 2018-01-02 MED ORDER — HYDROMORPHONE 1 MG/ML IV SOLN
INTRAVENOUS | Status: DC
  Administered 2018-01-02: 0.5 mg via INTRAVENOUS
  Administered 2018-01-02: 0.6 mg via INTRAVENOUS
  Filled 2018-01-02: qty 25

## 2018-01-02 MED ORDER — LACTATED RINGERS IV SOLN
INTRAVENOUS | Status: DC
  Administered 2018-01-02 (×2): via INTRAVENOUS

## 2018-01-02 MED ORDER — GABAPENTIN 300 MG PO CAPS
ORAL_CAPSULE | ORAL | Status: AC
  Filled 2018-01-02: qty 1

## 2018-01-02 MED ORDER — LACTATED RINGERS IV SOLN
INTRAVENOUS | Status: DC
  Administered 2018-01-02: 18:00:00 via INTRAVENOUS

## 2018-01-02 MED ORDER — LIDOCAINE-EPINEPHRINE 1 %-1:100000 IJ SOLN
INTRAMUSCULAR | Status: AC
  Filled 2018-01-02: qty 1

## 2018-01-02 MED ORDER — DIPHENHYDRAMINE HCL 50 MG/ML IJ SOLN: 12.5000 mg | Freq: Four times a day (QID) | INTRAMUSCULAR | Status: DC | PRN

## 2018-01-02 MED ORDER — NALOXONE HCL 0.4 MG/ML IJ SOLN: 0.4000 mg | INTRAMUSCULAR | Status: DC | PRN

## 2018-01-02 MED ORDER — ACETAMINOPHEN 500 MG PO TABS
1000.0000 mg | ORAL_TABLET | Freq: Once | ORAL | Status: AC
  Administered 2018-01-02: 1000 mg via ORAL

## 2018-01-02 MED ORDER — FAMOTIDINE IN NACL 20-0.9 MG/50ML-% IV SOLN
20.0000 mg | Freq: Two times a day (BID) | INTRAVENOUS | Status: DC
  Administered 2018-01-02: 20 mg via INTRAVENOUS
  Filled 2018-01-02 (×2): qty 50

## 2018-01-02 SURGICAL SUPPLY — 57 items
APPLICATOR ARISTA FLEXITIP XL (MISCELLANEOUS) ×4 IMPLANT
BENZOIN TINCTURE PRP APPL 2/3 (GAUZE/BANDAGES/DRESSINGS) ×4 IMPLANT
CATH ROBINSON RED A/P 16FR (CATHETERS) IMPLANT
CLOSURE WOUND 1/2 X4 (GAUZE/BANDAGES/DRESSINGS) ×1
CONT PATH 16OZ SNAP LID 3702 (MISCELLANEOUS) ×4 IMPLANT
COVER BACK TABLE 60X90IN (DRAPES) ×4 IMPLANT
COVER MAYO STAND STRL (DRAPES) ×4 IMPLANT
DECANTER SPIKE VIAL GLASS SM (MISCELLANEOUS) ×8 IMPLANT
DERMABOND ADVANCED (GAUZE/BANDAGES/DRESSINGS) ×2
DERMABOND ADVANCED .7 DNX12 (GAUZE/BANDAGES/DRESSINGS) ×2 IMPLANT
DILATOR CANAL MILEX (MISCELLANEOUS) ×4 IMPLANT
DRAPE CESAREAN BIRTH W POUCH (DRAPES) ×4 IMPLANT
DRAPE STERI URO 9X17 APER PCH (DRAPES) ×4 IMPLANT
DRSG OPSITE POSTOP 3X4 (GAUZE/BANDAGES/DRESSINGS) IMPLANT
DRSG OPSITE POSTOP 4X10 (GAUZE/BANDAGES/DRESSINGS) ×8 IMPLANT
DURAPREP 26ML APPLICATOR (WOUND CARE) ×4 IMPLANT
ELECT REM PT RETURN 9FT ADLT (ELECTROSURGICAL) ×4
ELECTRODE REM PT RTRN 9FT ADLT (ELECTROSURGICAL) ×2 IMPLANT
GLOVE BIOGEL M 6.5 STRL (GLOVE) ×8 IMPLANT
GLOVE BIOGEL PI IND STRL 6.5 (GLOVE) ×4 IMPLANT
GLOVE BIOGEL PI IND STRL 7.0 (GLOVE) ×6 IMPLANT
GLOVE BIOGEL PI INDICATOR 6.5 (GLOVE) ×4
GLOVE BIOGEL PI INDICATOR 7.0 (GLOVE) ×6
HEMOSTAT ARISTA ABSORB 3G PWDR (MISCELLANEOUS) ×8 IMPLANT
LEGGING LITHOTOMY PAIR STRL (DRAPES) ×4 IMPLANT
LIGASURE IMPACT 36 18CM CVD LR (INSTRUMENTS) IMPLANT
NEEDLE SPNL 22GX3.5 QUINCKE BK (NEEDLE) ×4 IMPLANT
NS IRRIG 1000ML POUR BTL (IV SOLUTION) ×4 IMPLANT
PACK LAVH (CUSTOM PROCEDURE TRAY) ×4 IMPLANT
PACK ROBOTIC GOWN (GOWN DISPOSABLE) ×4 IMPLANT
PACK TRENDGUARD 450 HYBRID PRO (MISCELLANEOUS) ×2 IMPLANT
PACK TRENDGUARD 600 HYBRD PROC (MISCELLANEOUS) IMPLANT
POUCH LAPAROSCOPIC INSTRUMENT (MISCELLANEOUS) ×4 IMPLANT
PROTECTOR NERVE ULNAR (MISCELLANEOUS) ×8 IMPLANT
SET IRRIG TUBING LAPAROSCOPIC (IRRIGATION / IRRIGATOR) ×4 IMPLANT
SHEARS HARMONIC ACE PLUS 36CM (ENDOMECHANICALS) IMPLANT
SLEEVE XCEL OPT CAN 5 100 (ENDOMECHANICALS) ×8 IMPLANT
SOLUTION ELECTROLUBE (MISCELLANEOUS) ×4 IMPLANT
SPONGE LAP 18X18 X RAY DECT (DISPOSABLE) ×8 IMPLANT
STRIP CLOSURE SKIN 1/2X4 (GAUZE/BANDAGES/DRESSINGS) ×3 IMPLANT
SUT PDS AB 0 CT1 27 (SUTURE) ×8 IMPLANT
SUT VIC AB 0 CT1 27 (SUTURE) ×6
SUT VIC AB 0 CT1 27XCR 8 STRN (SUTURE) ×6 IMPLANT
SUT VIC AB 0 CT1 36 (SUTURE) ×8 IMPLANT
SUT VIC AB 0 CTX 36 (SUTURE)
SUT VIC AB 0 CTX36XBRD ANBCTRL (SUTURE) IMPLANT
SUT VIC AB 2-0 CT1 27 (SUTURE) ×8
SUT VIC AB 2-0 CT1 TAPERPNT 27 (SUTURE) ×8 IMPLANT
SUT VIC AB 4-0 KS 27 (SUTURE) ×4 IMPLANT
SUT VIC AB 4-0 PS2 27 (SUTURE) IMPLANT
SUT VICRYL 1 TIES 12X18 (SUTURE) ×4 IMPLANT
TOWEL OR 17X24 6PK STRL BLUE (TOWEL DISPOSABLE) ×8 IMPLANT
TRAY FOLEY W/BAG SLVR 14FR (SET/KITS/TRAYS/PACK) ×4 IMPLANT
TRENDGUARD 450 HYBRID PRO PACK (MISCELLANEOUS) ×4
TRENDGUARD 600 HYBRID PROC PK (MISCELLANEOUS)
TROCAR XCEL NON-BLD 5MMX100MML (ENDOMECHANICALS) ×4 IMPLANT
WARMER LAPAROSCOPE (MISCELLANEOUS) ×4 IMPLANT

## 2018-01-02 NOTE — Transfer of Care (Signed)
Immediate Anesthesia Transfer of Care Note  Patient: Charlotte Ramirez  Procedure(s) Performed: HYSTERECTOMY ABDOMINAL WITH SALPINGECTOMY (Bilateral Abdomen)  Patient Location: PACU  Anesthesia Type:General  Level of Consciousness: sedated  Airway & Oxygen Therapy: Patient Spontanous Breathing and Patient connected to nasal cannula oxygen  Post-op Assessment: Report given to RN  Post vital signs: Reviewed and stable  Last Vitals:  Vitals Value Taken Time  BP    Temp    Pulse    Resp    SpO2      Last Pain:  Vitals:   01/02/18 1201  TempSrc: Oral  PainSc: 0-No pain      Patients Stated Pain Goal: 4 (01/02/18 1201)  Complications: No apparent anesthesia complications

## 2018-01-02 NOTE — OR Nursing (Signed)
Switched to a abdominal hysterectomy at 1330. Pt family informed by CFrediani, RN in surgical waiting room by phone

## 2018-01-02 NOTE — Op Note (Signed)
Hysterectomy Procedure Note  Indications: 57 y/o with recurrent high grade cervical dysplasia CIN II  With a h/o CIN III treated with cold knife conization September 08/2017 . LAVH/BS was planned  however cervix was flush with the vaginal mucosa. The cervical os could not be identified therfore the uterine manipulator could not be placed. Decision was made to proceed via laparotomy.   Pre-operative Diagnosis: Recurrent high grade cervical dysplasia CIN II Post-operative Diagnosis:Same plus cervical stenosis and cervical distortion.   Operation: Total abdominal hysterectomy with bilateral salpingectomy   Surgeon: Jessee AversOLE,Dava Rensch J.   Assistants: Dr. Myna HidalgoJennifer Ozan assisted due to the complexity of the surgery   Anesthesia: General endotracheal anesthesia  ASA Class: 2     Procedure Details  The patient was seen in the Holding Room. The risks, benefits, complications, treatment options, and expected outcomes were discussed with the patient.  The patient concurred with the proposed plan, giving informed consent.  The patient was taken to Operating Room # 4, identified as Charlotte Ramirez and the procedure verified as Laparoscopic assisted vaginal hysterectomy. . A Time Out was held and the above information confirmed.  After induction of anesthesia, the patient was draped and prepped in the usual sterile manner. Pt was placed in dorsal lithotomy  position after anesthesia and draped and prepped in the usual sterile manner. Foley catheter was placed.An attempt to identify the cerical os was made however failed. The uterine manipulator could not be placed. I decided to proceed with total abdominal hysterectomy. Pt was taken out of the dorsal lithotomy position and placed Supine.   A pfannenstiehl  incision was made and carried through the subcutaneous tissue to the fascia. Fascial incision was made and extended laterally . The rectus muscles were separated. The peritoneum was identified and entered.  Peritoneal incision was extended longitudinally.  [name]  retractor was placed and bowel was packed away from the surgical site.    Adhesions of the colon were excised from the left cornua of the uterus. The round ligaments were identified, cut, and ligated with 0-Vicryl. The anterior peritoneal reflection was incised and the bladder was dissected off the lower uterine segment. The right utero-ovarian ligament and fallopian tube were grasped, cut, and suture ligated with 0-Vicryl. The left utero-ovarian ligament and fallopian tube were grasped, cut and suture ligated with 0-Vicryl. Hemostasis was observed. The uterine vessels were skeletonized, then clamped, cut and suture ligated with 0-Vicryl suture. Serial pedicles of the cardinal and utero-sacral ligaments were clamped, cut, and suture ligated with 0-Vicryl. Entrance was made into the vagina and the uterus removed. Vaginal cuff angle sutures were placed incorporating the utero-sacral ligaments for support. The vaginal cuff was then closed with a interrupted  Figure of 8 stitch of 0- Vicryl. Lavage was carried out until clear. Hemostasis was observed. Arista was placed over the vaginal cuff   Retractor and all packing was removed from the abdomen. The fascia was approximated with running sutures of 0-Vicryl. Lavage was again carried out. Hemostasis was observed. The skin was approximated with staples.  Instrument, sponge, and needle counts were correct prior to abdominal closure and at the conclusion of the case.   Findings: Vaginal atrophy. Distorted cervix that was flush with the vaginal mucosa. The external os could not be identified. Normal appearing ovaries and fallopian tubes. Normal appearing uterus. Adhesion of the colon to the left cornua of the uterus.   Estimated Blood Loss:  less than 100 mL  Drains: Foley catheter          Total IV Fluids: per anesthesia ml         Specimens: uterus cervix and bilateral  fallopian tubes          Implants: none         Complications:  None; patient tolerated the procedure well.         Disposition: PACU - hemodynamically stable.         Condition: stable  Attending Attestation: I performed the procedure.

## 2018-01-02 NOTE — Anesthesia Procedure Notes (Signed)
Procedure Name: Intubation Date/Time: 01/02/2018 1:11 PM Performed by: Asher Muir, CRNA Pre-anesthesia Checklist: Patient identified, Emergency Drugs available, Suction available and Patient being monitored Patient Re-evaluated:Patient Re-evaluated prior to induction Oxygen Delivery Method: Circle system utilized and Simple face mask Preoxygenation: Pre-oxygenation with 100% oxygen Induction Type: IV induction Ventilation: Mask ventilation without difficulty Laryngoscope Size: Mac and 3 Grade View: Grade II Tube type: Oral Tube size: 7.0 mm Number of attempts: 1 Airway Equipment and Method: Stylet Placement Confirmation: ETT inserted through vocal cords under direct vision,  positive ETCO2 and breath sounds checked- equal and bilateral Secured at: 20 (right lip) cm Tube secured with: Tape Dental Injury: Teeth and Oropharynx as per pre-operative assessment

## 2018-01-02 NOTE — Anesthesia Postprocedure Evaluation (Signed)
Anesthesia Post Note  Patient: Charlotte Ramirez  Procedure(s) Performed: HYSTERECTOMY ABDOMINAL WITH SALPINGECTOMY (Bilateral Abdomen)     Patient location during evaluation: PACU Anesthesia Type: General Level of consciousness: awake and alert Pain management: pain level controlled Vital Signs Assessment: post-procedure vital signs reviewed and stable Respiratory status: spontaneous breathing, nonlabored ventilation, respiratory function stable and patient connected to nasal cannula oxygen Cardiovascular status: blood pressure returned to baseline and stable Postop Assessment: no apparent nausea or vomiting Anesthetic complications: no    Last Vitals:  Vitals:   01/02/18 1654 01/02/18 1704  BP: (!) 109/53   Pulse: 61   Resp: 14   Temp: (!) 36.3 C   SpO2: 100% 99%    Last Pain:  Vitals:   01/02/18 1704  TempSrc:   PainSc: 6    Pain Goal: Patients Stated Pain Goal: 4 (01/02/18 1620)               Cecile HearingStephen Edward Nani Ingram

## 2018-01-02 NOTE — Anesthesia Preprocedure Evaluation (Addendum)
Anesthesia Evaluation  Patient identified by MRN, date of birth, ID band Patient awake  General Assessment Comment:For laparsocpic assisted vaginal hysterectomy with bilateral salpingectomy due to recurrent cervical dysplasia.  Reviewed: Allergy & Precautions, NPO status , Patient's Chart, lab work & pertinent test results  History of Anesthesia Complications Negative for: history of anesthetic complications  Airway Mallampati: II  TM Distance: >3 FB Neck ROM: Full    Dental  (+) Teeth Intact, Dental Advisory Given   Pulmonary neg pulmonary ROS,    Pulmonary exam normal breath sounds clear to auscultation       Cardiovascular Exercise Tolerance: Good negative cardio ROS Normal cardiovascular exam Rhythm:Regular Rate:Normal     Neuro/Psych negative neurological ROS  negative psych ROS   GI/Hepatic Neg liver ROS, GERD  Medicated,  Endo/Other  negative endocrine ROS  Renal/GU negative Renal ROS     Musculoskeletal negative musculoskeletal ROS (+)   Abdominal   Peds  Hematology negative hematology ROS (+)   Anesthesia Other Findings Day of surgery medications reviewed with the patient.  Reproductive/Obstetrics CIN II (cervical intraepithelial neoplasia II)                            Anesthesia Physical Anesthesia Plan  ASA: II  Anesthesia Plan: General   Post-op Pain Management:    Induction: Intravenous  PONV Risk Score and Plan: 4 or greater and Scopolamine patch - Pre-op, Midazolam, Dexamethasone and Ondansetron  Airway Management Planned: Oral ETT  Additional Equipment:   Intra-op Plan:   Post-operative Plan: Extubation in OR  Informed Consent: I have reviewed the patients History and Physical, chart, labs and discussed the procedure including the risks, benefits and alternatives for the proposed anesthesia with the patient or authorized representative who has indicated  his/her understanding and acceptance.   Dental advisory given  Plan Discussed with: CRNA  Anesthesia Plan Comments: (Risks/benefits of general anesthesia discussed with patient including risk of damage to teeth, lips, gum, and tongue, nausea/vomiting, allergic reactions to medications, and the possibility of heart attack, stroke and death.  All patient questions answered.  Patient wishes to proceed.)        Anesthesia Quick Evaluation

## 2018-01-02 NOTE — H&P (Signed)
Date of Initial H&P: 12/31/2017  History reviewed, patient examined, no change in status, stable for surgery.

## 2018-01-03 ENCOUNTER — Encounter (HOSPITAL_COMMUNITY): Payer: Self-pay | Admitting: Obstetrics and Gynecology

## 2018-01-03 LAB — CBC
HEMATOCRIT: 36.8 % (ref 36.0–46.0)
HEMOGLOBIN: 12.3 g/dL (ref 12.0–15.0)
MCH: 29.6 pg (ref 26.0–34.0)
MCHC: 33.4 g/dL (ref 30.0–36.0)
MCV: 88.5 fL (ref 78.0–100.0)
Platelets: 269 10*3/uL (ref 150–400)
RBC: 4.16 MIL/uL (ref 3.87–5.11)
RDW: 14 % (ref 11.5–15.5)
WBC: 13.3 10*3/uL — ABNORMAL HIGH (ref 4.0–10.5)

## 2018-01-03 MED ORDER — IBUPROFEN 800 MG PO TABS: 800.0000 mg | ORAL_TABLET | Freq: Three times a day (TID) | ORAL | 0 refills | Status: AC | PRN

## 2018-01-03 MED ORDER — DOCUSATE SODIUM 100 MG PO CAPS: 100.0000 mg | ORAL_CAPSULE | Freq: Two times a day (BID) | ORAL | 0 refills | Status: AC

## 2018-01-03 MED ORDER — OXYCODONE-ACETAMINOPHEN 5-325 MG PO TABS: 1.0000 | ORAL_TABLET | Freq: Four times a day (QID) | ORAL | 0 refills | Status: AC | PRN

## 2018-01-03 NOTE — Progress Notes (Signed)
Patient discharged home with husband. Discharge teaching, home care, prescriptions, and reasons to seek care discussed. No questions at this time.

## 2018-01-03 NOTE — Discharge Summary (Signed)
Physician Discharge Summary  Patient ID: Charlotte Ramirez MRN: [id number] DOB/AGE: 18-Apr-1961 57 y.o.  Admit date: 01/02/2018 Discharge date: 01/03/2018  Admission Diagnoses: cervical dysplasia, cervical stenosis  Discharge Diagnoses:  Active Problems:   S/P TAH (total abdominal hysterectomy)   Discharged Condition: stable  Hospital Course: 57yo admitted for scheduled surgery- underwent TAH, BS.  For further information, please see operative note.  Postop course was uncomplicated and pt desired early discharge.  Meeting postop milestones appropriately, f/u as outpatient  Consults: None  Significant Diagnostic Studies: labs:  Results for orders placed or performed during the hospital encounter of 01/02/18 (from the past 24 hour(s))  CBC     Status: Abnormal   Collection Time: 01/03/18  5:45 AM  Result Value Ref Range   WBC 13.3 (H) 4.0 - 10.5 K/uL   RBC 4.16 3.87 - 5.11 MIL/uL   Hemoglobin 12.3 12.0 - 15.0 g/dL   HCT 40.9 81.1 - 91.4 %   MCV 88.5 78.0 - 100.0 fL   MCH 29.6 26.0 - 34.0 pg   MCHC 33.4 30.0 - 36.0 g/dL   RDW 78.2 95.6 - 21.3 %   Platelets 269 150 - 400 K/uL     Treatments: IV hydration, antibiotics: Ancef, analgesia: toradol and surgery: TAH, BS   Discharge Exam: Blood pressure 98/67, pulse 76, temperature 99.2 F (37.3 C), temperature source Oral, resp. rate 18, height 5' 4.5" (1.638 m), weight 77.9 kg (171 lb 12.8 oz), SpO2 98 %. Gen: A&Ox3, NAD CV: RRR, no MRG Resp: CTAB Abdomen: soft, NT, ND +BS Incision: pressure dressing removed, honeycomb with old blood- no active bleeding noted Ext: No edema, no calf tenderness bilaterally, SCDs in place   Disposition:    Allergies as of 01/03/2018   No Known Allergies     Medication List    TAKE these medications   BIOTIN PO Take 1 tablet by mouth daily.   CALCIUM PO Take 2 tablets by mouth daily.   cetirizine 10 MG tablet Commonly known as:  ZYRTEC Take 10 mg by mouth daily.   CRANBERRY PO Take  1 capsule by mouth daily.   docusate sodium 100 MG capsule Commonly known as:  COLACE Take 1 capsule (100 mg total) by mouth 2 (two) times daily.   ESTROVEN PO Take 1 tablet by mouth daily.   FISH OIL PO Take 1 capsule by mouth daily.   GINKGO BILOBA PO Take 1 tablet by mouth daily.   ibuprofen 800 MG tablet Commonly known as:  ADVIL,MOTRIN Take 1 tablet (800 mg total) by mouth every 8 (eight) hours as needed (mild pain). What changed:    medication strength  how much to take  reasons to take this Notes to patient:  Do not take before 12pm   multivitamin with minerals Tabs tablet Take 1 tablet by mouth daily.   OVER THE COUNTER MEDICATION Take 1 Scoop by mouth daily. Isagenix Ionix Supreme (thiamin/riboflavin/niacin/vitamin B6/vitamin B12/Zinc & Proprietary Blend) Mix with 4 ounces of water   oxyCODONE-acetaminophen 5-325 MG tablet Commonly known as:  PERCOCET/ROXICET Take 1 tablet by mouth every 6 (six) hours as needed for up to 7 days for moderate pain or severe pain ((when tolerating fluids)).   PROBIOTIC PO Take 1 capsule by mouth daily.   VITAMIN B-12 PO Take 1 tablet by mouth daily.   VITAMIN D3 PO Take 1 capsule by mouth daily.   ZANTAC 150 MG tablet Generic drug:  ranitidine Take 150 mg by mouth daily.  Follow-up Information    Gerald Leitzole, Tara, MD Follow up in 2 week(s).   Specialty:  Obstetrics and Gynecology Contact information: [address] [place] [address] [phone]           Signed: Sharon SellerJennifer M Jaisean Monteforte 01/03/2018, 1:21 PM

## 2018-01-03 NOTE — Discharge Instructions (Signed)
Abdominal Hysterectomy, Care After °This sheet gives you information about how to care for yourself after your procedure. Your doctor may also give you more specific instructions. If you have problems or questions, contact your doctor. °Follow these instructions at home: °Bathing °· Do not take baths, swim, or use a hot tub until your doctor says it is okay. Ask your doctor if you can take showers. You may only be allowed to take sponge baths for bathing. °· Keep the bandage (dressing) dry until your doctor says it can be taken off. °Surgical cut ( °incision) care °· Follow instructions from your doctor about how to take care of your cut from surgery. Make sure you: °? Wash your hands with soap and water before you change your bandage (dressing). If you cannot use soap and water, use hand sanitizer. °? Change your bandage as told by your doctor. °? Leave stitches (sutures), skin glue, or skin tape (adhesive) strips in place. They may need to stay in place for 2 weeks or longer. If tape strips get loose and curl up, you may trim the loose edges. Do not remove tape strips completely unless your doctor says it is okay. °· Check your surgical cut area every day for signs of infection. Check for: °? Redness, swelling, or pain. °? Fluid or blood. °? Warmth. °? Pus or a bad smell. °Activity °· Do gentle, daily exercise as told by your doctor. You may be told to take short walks every day and go farther each time. °· Do not lift anything that is heavier than 10 lb (4.5 kg), or the limit that your doctor tells you, until he or she says that it is safe. °· Do not drive or use heavy machinery while taking prescription pain medicine. °· Do not drive for 24 hours if you were given a medicine to help you relax (sedative). °· Follow your doctor's advice about exercise, driving, and general activities. Ask your doctor what activities are safe for you. °Lifestyle °· Do not douche, use tampons, or have sex for at least 6 weeks or as  told by your doctor. °· Do not drink alcohol until your doctor says it is okay. °· Drink enough fluid to keep your pee (urine) clear or pale yellow. °· Try to have someone at home with you for the first 1-2 weeks to help. °· Do not use any products that contain nicotine or tobacco, such as cigarettes and e-cigarettes. These can slow down healing. If you need help quitting, ask your doctor. °General instructions °· Take over-the-counter and prescription medicines only as told by your doctor. °· Do not take aspirin or ibuprofen. These medicines can cause bleeding. °· To prevent or treat constipation while you are taking prescription pain medicine, your doctor may suggest that you: °? Drink enough fluid to keep your urine clear or pale yellow. °? Take over-the-counter or prescription medicines. °? Eat foods that are high in fiber, such as: °§ Fresh fruits and vegetables. °§ Whole grains. °§ Beans. °? Limit foods that are high in fat and processed sugars, such as fried and sweet foods. °· Keep all follow-up visits as told by your doctor. This is important. °Contact a doctor if: °· You have chills or fever. °· You have redness, swelling, or pain around your cut. °· You have fluid or blood coming from your cut. °· Your cut feels warm to the touch. °· You have pus or a bad smell coming from your cut. °· Your cut breaks   open. °· You feel dizzy or light-headed. °· You have pain or bleeding when you pee. °· You keep having watery poop (diarrhea). °· You keep feeling sick to your stomach (nauseous) or keep throwing up (vomiting). °· You have unusual fluid (discharge) coming from your vagina. °· You have a rash. °· You have a reaction to your medicine. °· Your pain medicine does not help. °Get help right away if: °· You have a fever and your symptoms get worse all of a sudden. °· You have very bad belly (abdominal) pain. °· You are short of breath. °· You pass out (faint). °· You have pain, swelling, or redness of your  leg. °· You bleed a lot from your vagina and notice clumps of blood (clots). °Summary °· Do not take baths, swim, or use a hot tub until your doctor says it is okay. Ask your doctor if you can take showers. You may only be allowed to take sponge baths for bathing. °· Follow your doctor's advice about exercise, driving, and general activities. Ask your doctor what activities are safe for you. °· Do not lift anything that is heavier than 10 lb (4.5 kg), or the limit that your doctor tells you, until he or she says that it is safe. °· Try to have someone at home with you for the first 1-2 weeks to help. °This information is not intended to replace advice given to you by your health care provider. Make sure you discuss any questions you have with your health care provider. °Document Released: 06/06/2008 Document Revised: 08/16/2016 Document Reviewed: 08/16/2016 °Elsevier Interactive Patient Education © 2017 [employer] ° °

## 2018-01-03 NOTE — Progress Notes (Addendum)
Postop Note Day # 1  S:  Patient resting comfortable in bed.  Pain controlled.  Tolerating general diet. Not yet passing flatus, no BM.  Minimal bleeding.  Ambulating without difficulty.  She denies n/v/f/c, SOB, or CP.  Pt plans on breastfeeding.  O: Temp:  [97.4 F (36.3 C)-98.3 F (36.8 C)] 98.2 F (36.8 C) (04/25 0026) Pulse Rate:  [58-73] 73 (04/25 0026) Resp:  [10-21] 14 (04/25 0609) BP: (94-119)/(53-82) 119/67 (04/25 0026) SpO2:  [95 %-100 %] 95 % (04/25 0609) Weight:  [77.9 kg (171 lb 12.8 oz)] 77.9 kg (171 lb 12.8 oz) (04/24 1704)   Gen: A&Ox3, NAD CV: RRR, no MRG Resp: CTAB Abdomen: soft, NT, ND +BS Incision: pressure dressing removed, honeycomb with old blood- no active bleeding noted Ext: No edema, no calf tenderness bilaterally, SCDs in place  Labs:  Recent Labs    12/31/17 1240 01/03/18 0545  HGB 13.8 12.3    A/P: Pt is a 57 y.o. s/p TAH, BS, POD#1  - Pain well controlled -GU: UOP is adequate and voiding freely -GI: Tolerating general diet -Activity: encouraged sitting up to chair and ambulation as tolerated -Prophylaxis: early ambulation -Labs: stable as above  Meeting postop milestones appropriately and pt desires discharge today.    Myna HidalgoJennifer Laurian Edrington, DO [phone] (pager) [phone] (office)

## 2018-02-09 DIAGNOSIS — Z0100 Encounter for examination of eyes and vision without abnormal findings: Secondary | ICD-10-CM | POA: Diagnosis not present

## 2018-05-02 ENCOUNTER — Other Ambulatory Visit: Payer: Self-pay | Admitting: Obstetrics and Gynecology

## 2018-05-02 DIAGNOSIS — Z1231 Encounter for screening mammogram for malignant neoplasm of breast: Secondary | ICD-10-CM

## 2018-06-03 ENCOUNTER — Ambulatory Visit
Admission: RE | Admit: 2018-06-03 | Discharge: 2018-06-03 | Disposition: A | Discharge status: Home / Self Care | Payer: BLUE CROSS/BLUE SHIELD | Source: Ambulatory Visit | Attending: Obstetrics and Gynecology | Admitting: Obstetrics and Gynecology

## 2018-06-03 DIAGNOSIS — Z1231 Encounter for screening mammogram for malignant neoplasm of breast: Secondary | ICD-10-CM

## 2018-10-07 DIAGNOSIS — J069 Acute upper respiratory infection, unspecified: Secondary | ICD-10-CM | POA: Diagnosis not present

## 2018-10-22 ENCOUNTER — Other Ambulatory Visit (HOSPITAL_COMMUNITY)
Admission: RE | Admit: 2018-10-22 | Discharge: 2018-10-22 | Disposition: A | Discharge status: Home / Self Care | Payer: BLUE CROSS/BLUE SHIELD | Source: Ambulatory Visit | Attending: Obstetrics and Gynecology | Admitting: Obstetrics and Gynecology

## 2018-10-22 ENCOUNTER — Other Ambulatory Visit: Payer: Self-pay | Admitting: Obstetrics and Gynecology

## 2018-10-22 DIAGNOSIS — Z01419 Encounter for gynecological examination (general) (routine) without abnormal findings: Secondary | ICD-10-CM | POA: Insufficient documentation

## 2018-10-22 DIAGNOSIS — N930 Postcoital and contact bleeding: Secondary | ICD-10-CM | POA: Diagnosis not present

## 2018-10-22 DIAGNOSIS — N951 Menopausal and female climacteric states: Secondary | ICD-10-CM | POA: Diagnosis not present

## 2018-10-22 DIAGNOSIS — N899 Noninflammatory disorder of vagina, unspecified: Secondary | ICD-10-CM | POA: Diagnosis not present

## 2018-10-25 LAB — CYTOLOGY - PAP: HPV (WINDOPATH): DETECTED — AB

## 2018-11-18 DIAGNOSIS — N952 Postmenopausal atrophic vaginitis: Secondary | ICD-10-CM | POA: Diagnosis not present

## 2018-11-18 DIAGNOSIS — B977 Papillomavirus as the cause of diseases classified elsewhere: Secondary | ICD-10-CM | POA: Diagnosis not present

## 2018-11-18 DIAGNOSIS — N899 Noninflammatory disorder of vagina, unspecified: Secondary | ICD-10-CM | POA: Diagnosis not present

## 2018-11-18 DIAGNOSIS — R87622 Low grade squamous intraepithelial lesion on cytologic smear of vagina (LGSIL): Secondary | ICD-10-CM | POA: Diagnosis not present

## 2018-12-27 ENCOUNTER — Other Ambulatory Visit: Payer: Self-pay | Admitting: Obstetrics and Gynecology

## 2018-12-27 DIAGNOSIS — N870 Mild cervical dysplasia: Secondary | ICD-10-CM | POA: Diagnosis not present

## 2018-12-27 DIAGNOSIS — N952 Postmenopausal atrophic vaginitis: Secondary | ICD-10-CM | POA: Diagnosis not present

## 2019-02-08 DIAGNOSIS — S61205A Unspecified open wound of left ring finger without damage to nail, initial encounter: Secondary | ICD-10-CM | POA: Diagnosis not present

## 2019-02-08 DIAGNOSIS — S61203A Unspecified open wound of left middle finger without damage to nail, initial encounter: Secondary | ICD-10-CM | POA: Diagnosis not present

## 2019-02-15 DIAGNOSIS — Z4802 Encounter for removal of sutures: Secondary | ICD-10-CM | POA: Diagnosis not present

## 2019-03-20 DIAGNOSIS — Z0100 Encounter for examination of eyes and vision without abnormal findings: Secondary | ICD-10-CM | POA: Diagnosis not present

## 2019-06-03 DIAGNOSIS — Z0000 Encounter for general adult medical examination without abnormal findings: Secondary | ICD-10-CM | POA: Diagnosis not present

## 2019-06-03 DIAGNOSIS — Z13220 Encounter for screening for lipoid disorders: Secondary | ICD-10-CM | POA: Diagnosis not present

## 2019-07-10 DIAGNOSIS — F4322 Adjustment disorder with anxiety: Secondary | ICD-10-CM | POA: Diagnosis not present

## 2019-07-10 DIAGNOSIS — F4310 Post-traumatic stress disorder, unspecified: Secondary | ICD-10-CM | POA: Diagnosis not present

## 2019-07-29 DIAGNOSIS — Z20828 Contact with and (suspected) exposure to other viral communicable diseases: Secondary | ICD-10-CM | POA: Diagnosis not present

## 2019-08-19 DIAGNOSIS — F4322 Adjustment disorder with anxiety: Secondary | ICD-10-CM | POA: Diagnosis not present

## 2019-08-19 DIAGNOSIS — F4310 Post-traumatic stress disorder, unspecified: Secondary | ICD-10-CM | POA: Diagnosis not present

## 2019-09-23 DIAGNOSIS — F4322 Adjustment disorder with anxiety: Secondary | ICD-10-CM | POA: Diagnosis not present

## 2019-09-23 DIAGNOSIS — F4310 Post-traumatic stress disorder, unspecified: Secondary | ICD-10-CM | POA: Diagnosis not present

## 2019-10-12 DIAGNOSIS — L255 Unspecified contact dermatitis due to plants, except food: Secondary | ICD-10-CM | POA: Diagnosis not present

## 2019-10-12 DIAGNOSIS — R21 Rash and other nonspecific skin eruption: Secondary | ICD-10-CM | POA: Diagnosis not present

## 2019-10-16 DIAGNOSIS — L259 Unspecified contact dermatitis, unspecified cause: Secondary | ICD-10-CM | POA: Diagnosis not present

## 2019-10-27 DIAGNOSIS — Z01419 Encounter for gynecological examination (general) (routine) without abnormal findings: Secondary | ICD-10-CM | POA: Diagnosis not present

## 2019-10-31 ENCOUNTER — Other Ambulatory Visit: Payer: Self-pay | Admitting: Obstetrics and Gynecology

## 2019-10-31 DIAGNOSIS — Z1231 Encounter for screening mammogram for malignant neoplasm of breast: Secondary | ICD-10-CM

## 2019-11-13 DIAGNOSIS — F4322 Adjustment disorder with anxiety: Secondary | ICD-10-CM | POA: Diagnosis not present

## 2019-11-13 DIAGNOSIS — F4310 Post-traumatic stress disorder, unspecified: Secondary | ICD-10-CM | POA: Diagnosis not present

## 2019-11-21 ENCOUNTER — Ambulatory Visit: Payer: BLUE CROSS/BLUE SHIELD

## 2019-12-05 ENCOUNTER — Ambulatory Visit: Payer: BLUE CROSS/BLUE SHIELD

## 2019-12-19 DIAGNOSIS — H903 Sensorineural hearing loss, bilateral: Secondary | ICD-10-CM | POA: Diagnosis not present

## 2020-01-19 ENCOUNTER — Ambulatory Visit: Payer: BLUE CROSS/BLUE SHIELD

## 2020-01-20 ENCOUNTER — Other Ambulatory Visit: Payer: Self-pay

## 2020-01-20 ENCOUNTER — Ambulatory Visit
Admission: RE | Admit: 2020-01-20 | Discharge: 2020-01-20 | Disposition: A | Discharge status: Home / Self Care | Payer: BC Managed Care – PPO | Source: Ambulatory Visit | Attending: Obstetrics and Gynecology | Admitting: Obstetrics and Gynecology

## 2020-01-20 DIAGNOSIS — Z1231 Encounter for screening mammogram for malignant neoplasm of breast: Secondary | ICD-10-CM | POA: Diagnosis not present

## 2020-06-03 DIAGNOSIS — Z0000 Encounter for general adult medical examination without abnormal findings: Secondary | ICD-10-CM | POA: Diagnosis not present

## 2020-06-03 DIAGNOSIS — Z131 Encounter for screening for diabetes mellitus: Secondary | ICD-10-CM | POA: Diagnosis not present

## 2020-06-03 DIAGNOSIS — Z23 Encounter for immunization: Secondary | ICD-10-CM | POA: Diagnosis not present

## 2020-06-03 DIAGNOSIS — Z13220 Encounter for screening for lipoid disorders: Secondary | ICD-10-CM | POA: Diagnosis not present

## 2020-10-05 DIAGNOSIS — F4310 Post-traumatic stress disorder, unspecified: Secondary | ICD-10-CM | POA: Diagnosis not present

## 2020-10-05 DIAGNOSIS — F4322 Adjustment disorder with anxiety: Secondary | ICD-10-CM | POA: Diagnosis not present

## 2021-02-03 ENCOUNTER — Other Ambulatory Visit: Payer: Self-pay | Admitting: Family Medicine

## 2021-02-03 DIAGNOSIS — Z1231 Encounter for screening mammogram for malignant neoplasm of breast: Secondary | ICD-10-CM

## 2021-02-08 ENCOUNTER — Other Ambulatory Visit: Payer: Self-pay

## 2021-02-08 ENCOUNTER — Ambulatory Visit
Admission: RE | Admit: 2021-02-08 | Discharge: 2021-02-08 | Disposition: A | Discharge status: Home / Self Care | Payer: 59 | Source: Ambulatory Visit

## 2021-02-08 DIAGNOSIS — Z1231 Encounter for screening mammogram for malignant neoplasm of breast: Secondary | ICD-10-CM

## 2022-02-08 ENCOUNTER — Other Ambulatory Visit: Payer: Self-pay | Admitting: Obstetrics and Gynecology

## 2022-02-08 ENCOUNTER — Other Ambulatory Visit: Payer: Self-pay | Admitting: Psychiatry

## 2022-02-08 DIAGNOSIS — Z1231 Encounter for screening mammogram for malignant neoplasm of breast: Secondary | ICD-10-CM

## 2022-02-10 ENCOUNTER — Ambulatory Visit
Admission: RE | Admit: 2022-02-10 | Discharge: 2022-02-10 | Disposition: A | Discharge status: Home / Self Care | Payer: PRIVATE HEALTH INSURANCE | Source: Ambulatory Visit | Attending: Obstetrics and Gynecology | Admitting: Obstetrics and Gynecology

## 2022-02-10 DIAGNOSIS — Z1231 Encounter for screening mammogram for malignant neoplasm of breast: Secondary | ICD-10-CM

## 2022-03-16 ENCOUNTER — Other Ambulatory Visit (HOSPITAL_COMMUNITY)
Admission: RE | Admit: 2022-03-16 | Discharge: 2022-03-16 | Disposition: A | Discharge status: Home / Self Care | Payer: PRIVATE HEALTH INSURANCE | Source: Ambulatory Visit | Attending: Obstetrics and Gynecology | Admitting: Obstetrics and Gynecology

## 2022-03-16 DIAGNOSIS — Z01419 Encounter for gynecological examination (general) (routine) without abnormal findings: Secondary | ICD-10-CM | POA: Insufficient documentation

## 2022-03-20 LAB — CYTOLOGY - PAP
Comment: NEGATIVE
Diagnosis: NEGATIVE
High risk HPV: NEGATIVE

## 2022-06-21 DIAGNOSIS — L209 Atopic dermatitis, unspecified: Secondary | ICD-10-CM | POA: Diagnosis not present

## 2022-06-21 DIAGNOSIS — N951 Menopausal and female climacteric states: Secondary | ICD-10-CM | POA: Diagnosis not present

## 2022-06-21 DIAGNOSIS — E7800 Pure hypercholesterolemia, unspecified: Secondary | ICD-10-CM | POA: Diagnosis not present

## 2022-06-21 DIAGNOSIS — Z23 Encounter for immunization: Secondary | ICD-10-CM | POA: Diagnosis not present

## 2022-06-21 DIAGNOSIS — Z0000 Encounter for general adult medical examination without abnormal findings: Secondary | ICD-10-CM | POA: Diagnosis not present

## 2023-03-13 ENCOUNTER — Other Ambulatory Visit: Payer: Self-pay | Admitting: Obstetrics and Gynecology

## 2023-03-13 DIAGNOSIS — Z1231 Encounter for screening mammogram for malignant neoplasm of breast: Secondary | ICD-10-CM

## 2023-03-14 ENCOUNTER — Ambulatory Visit
Admission: RE | Admit: 2023-03-14 | Discharge: 2023-03-14 | Disposition: A | Discharge status: Home / Self Care | Payer: BC Managed Care – PPO | Source: Ambulatory Visit | Attending: Obstetrics and Gynecology | Admitting: Obstetrics and Gynecology

## 2023-03-14 DIAGNOSIS — Z1231 Encounter for screening mammogram for malignant neoplasm of breast: Secondary | ICD-10-CM | POA: Diagnosis not present

## 2023-03-20 DIAGNOSIS — Z01419 Encounter for gynecological examination (general) (routine) without abnormal findings: Secondary | ICD-10-CM | POA: Diagnosis not present

## 2023-07-21 IMAGING — MG MM DIGITAL SCREENING BILAT W/ TOMO AND CAD
8 series · 8 of 24 positions shown · non-contrast
Comparison: Previous exam(s).

CLINICAL DATA: Screening.

EXAM:
DIGITAL SCREENING BILATERAL MAMMOGRAM WITH TOMOSYNTHESIS AND CAD
TECHNIQUE: Bilateral screening digital craniocaudal and mediolateral oblique
mammograms were obtained. Bilateral screening digital breast
tomosynthesis was performed. The images were evaluated with
computer-aided detection.

[L MLO synth-2D]
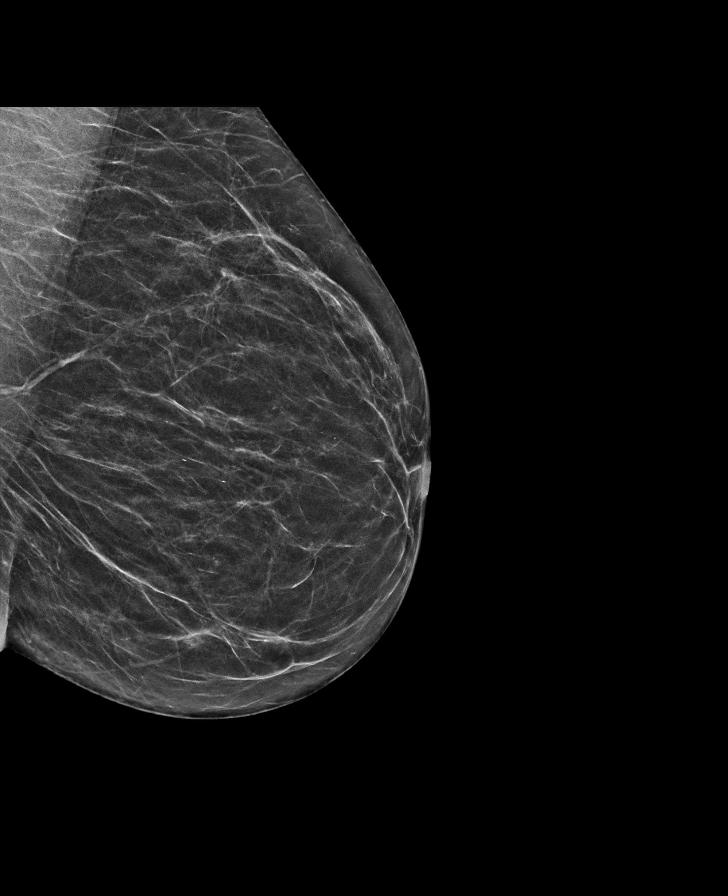

[R CC synth-2D]
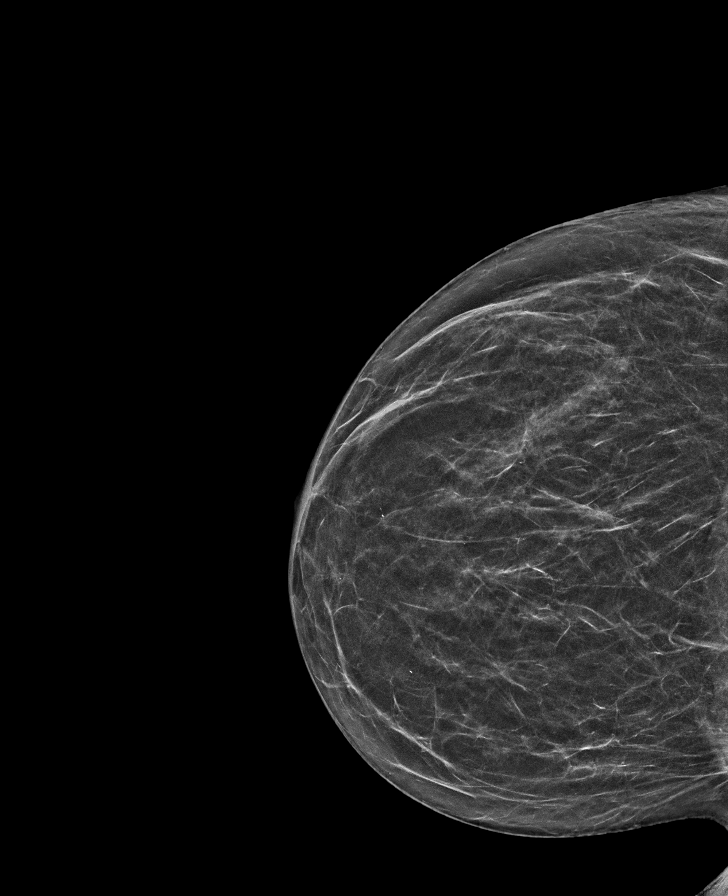

[R MLO synth-2D]
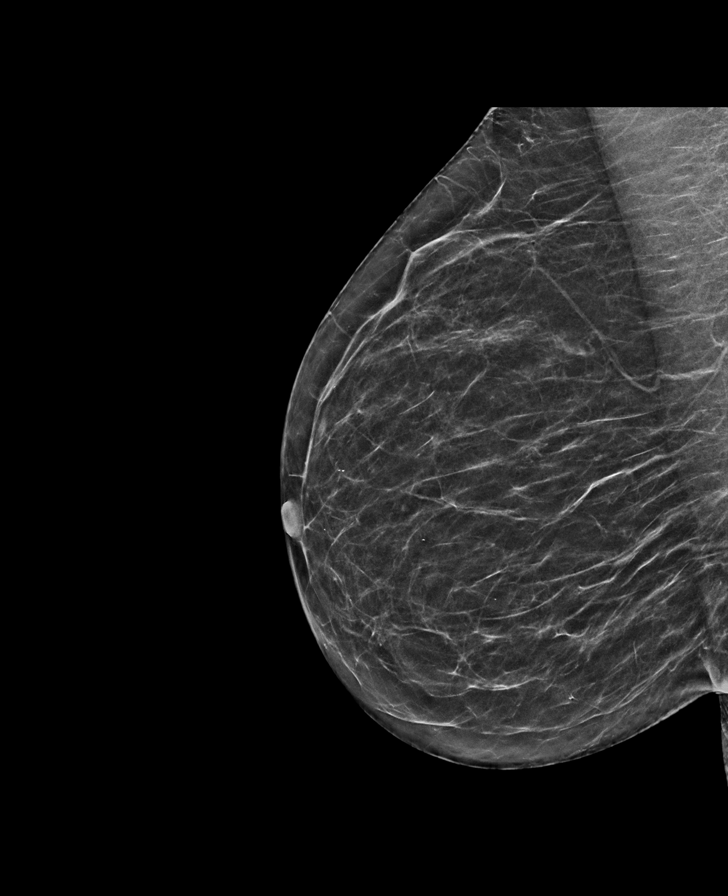

[L CC synth-2D]
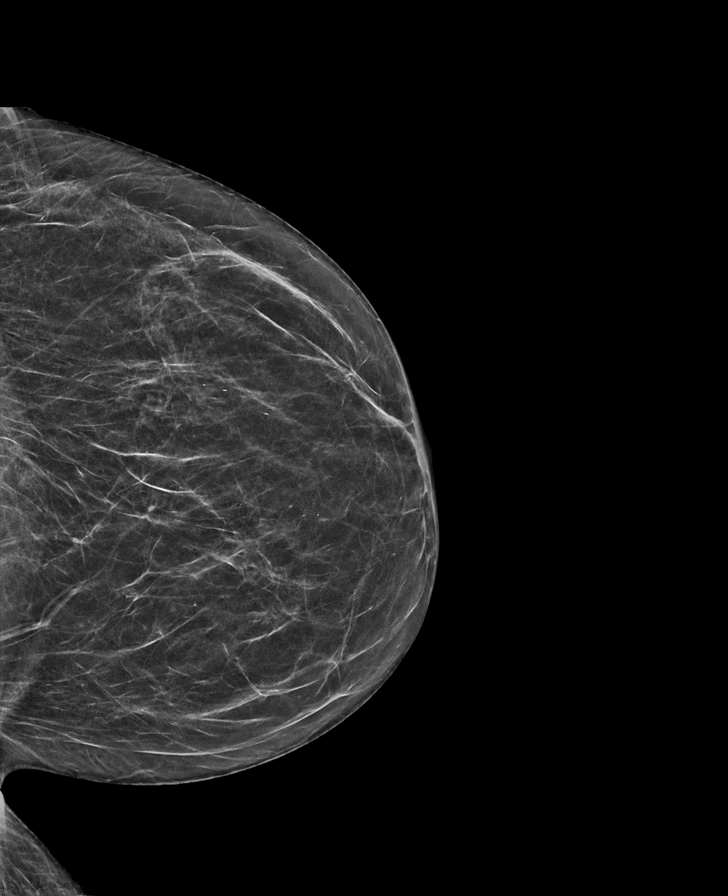

[L CC tomo · tomo slice 27/52.0]
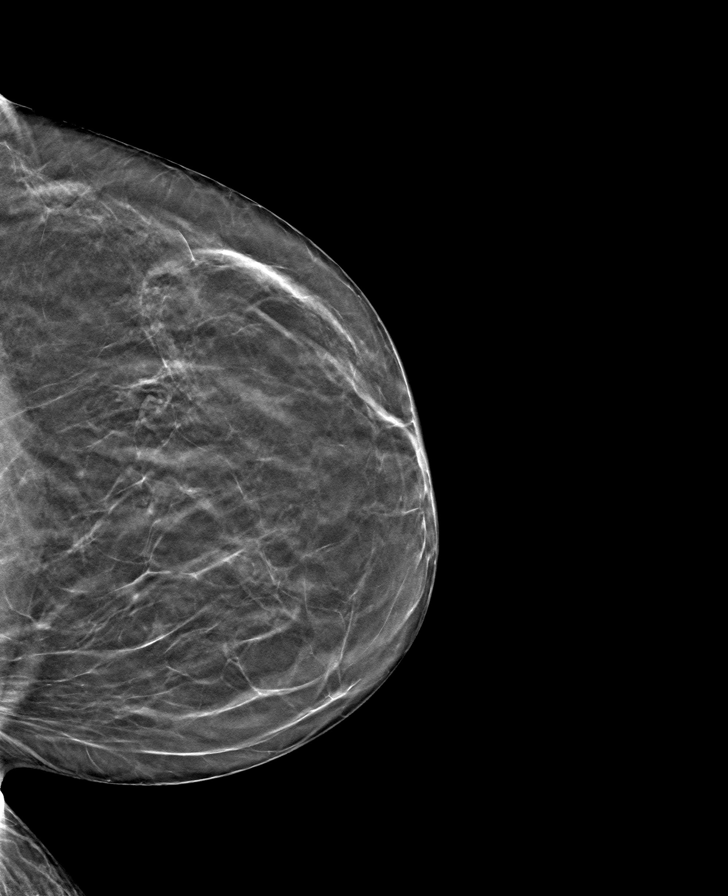

[R MLO tomo · tomo slice 28/55.0]
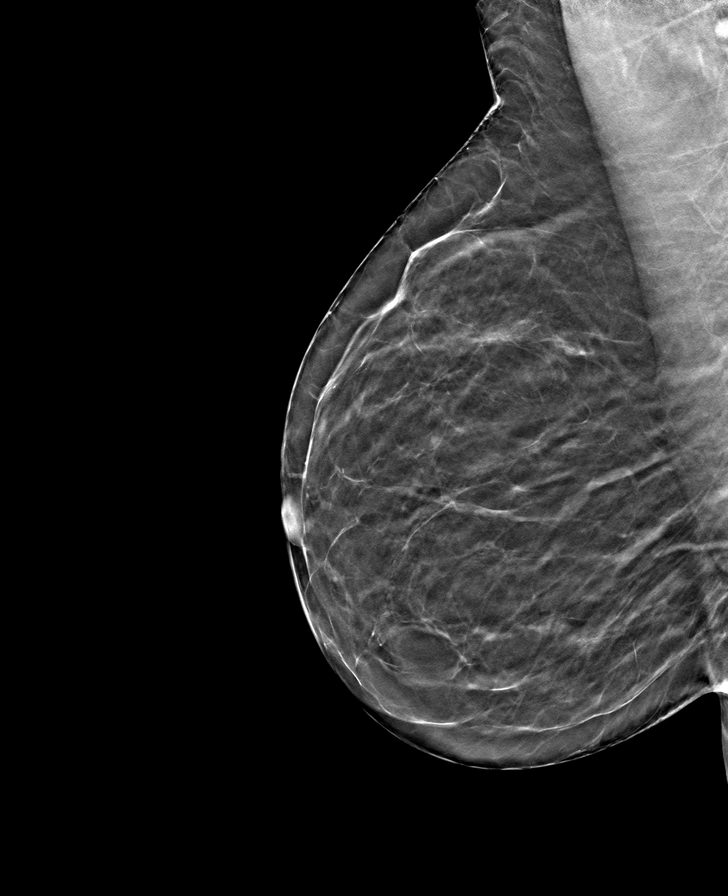

[L MLO tomo · tomo slice 27/54.0]
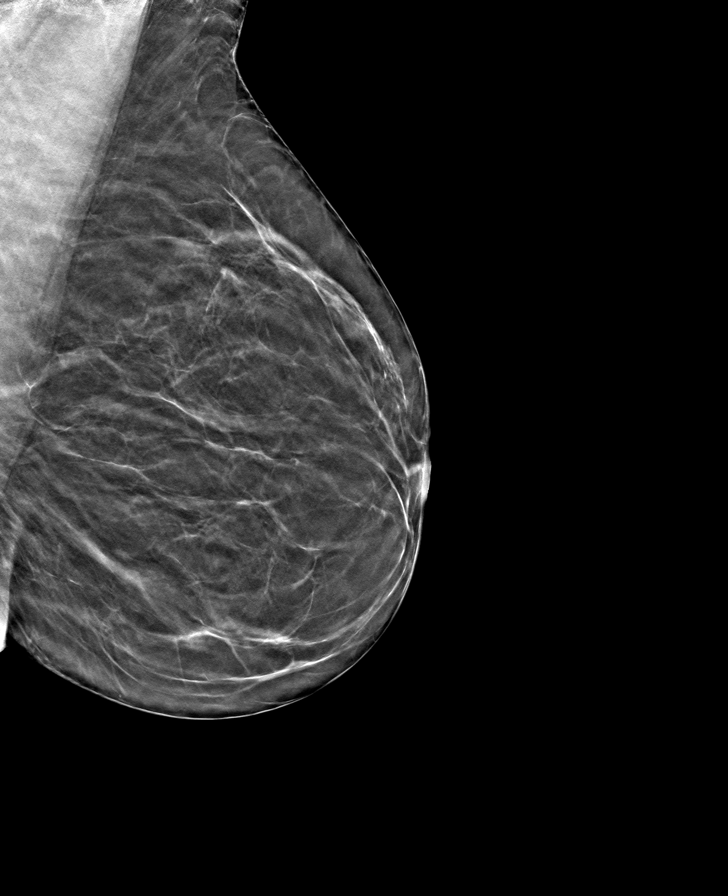

[R CC tomo · tomo slice 27/54.0]
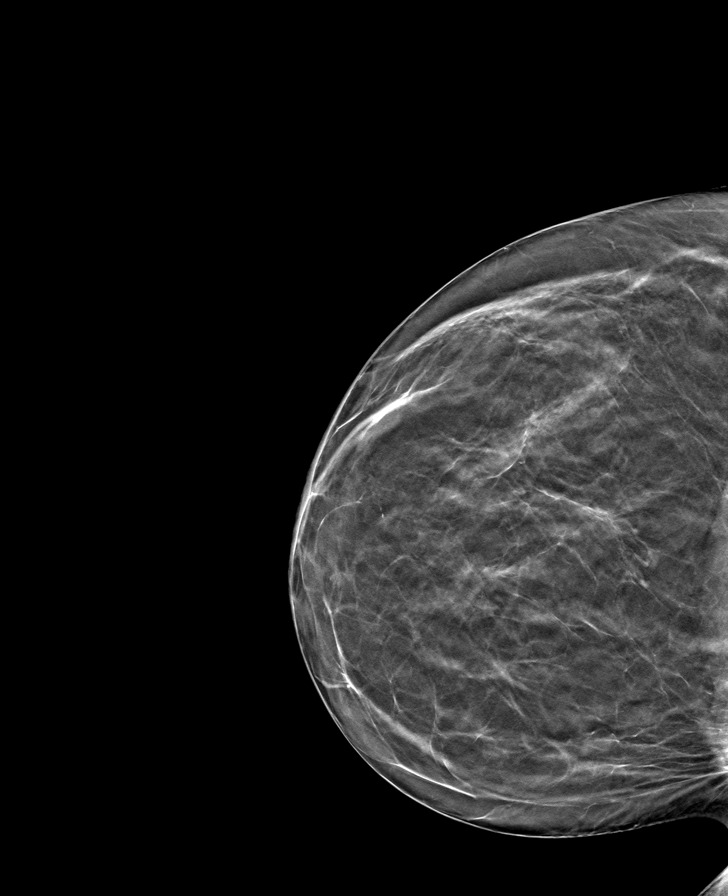

[8 of 24 positions shown; findings below may reference images not displayed]

ACR Breast Density Category b: There are scattered areas of
fibroglandular density.
FINDINGS: There are no findings suspicious for malignancy.
IMPRESSION: No mammographic evidence of malignancy. A result letter of this
screening mammogram will be mailed directly to the patient.

RECOMMENDATION:
Screening mammogram in one year. (Code:51-O-LD2)

BI-RADS CATEGORY  1: Negative.

## 2023-07-25 DIAGNOSIS — Z0000 Encounter for general adult medical examination without abnormal findings: Secondary | ICD-10-CM | POA: Diagnosis not present

## 2023-07-25 DIAGNOSIS — Z23 Encounter for immunization: Secondary | ICD-10-CM | POA: Diagnosis not present

## 2023-07-25 DIAGNOSIS — E7800 Pure hypercholesterolemia, unspecified: Secondary | ICD-10-CM | POA: Diagnosis not present

## 2023-08-24 DIAGNOSIS — R945 Abnormal results of liver function studies: Secondary | ICD-10-CM | POA: Diagnosis not present

## 2024-03-28 DIAGNOSIS — Z01419 Encounter for gynecological examination (general) (routine) without abnormal findings: Secondary | ICD-10-CM | POA: Diagnosis not present

## 2024-05-27 DIAGNOSIS — Z1231 Encounter for screening mammogram for malignant neoplasm of breast: Secondary | ICD-10-CM | POA: Diagnosis not present

## 2024-07-08 DIAGNOSIS — Z1211 Encounter for screening for malignant neoplasm of colon: Secondary | ICD-10-CM | POA: Diagnosis not present

## 2024-07-08 DIAGNOSIS — K5730 Diverticulosis of large intestine without perforation or abscess without bleeding: Secondary | ICD-10-CM | POA: Diagnosis not present

## 2024-07-25 DIAGNOSIS — N951 Menopausal and female climacteric states: Secondary | ICD-10-CM | POA: Diagnosis not present

## 2024-07-25 DIAGNOSIS — E7800 Pure hypercholesterolemia, unspecified: Secondary | ICD-10-CM | POA: Diagnosis not present

## 2024-07-25 DIAGNOSIS — Z0000 Encounter for general adult medical examination without abnormal findings: Secondary | ICD-10-CM | POA: Diagnosis not present

## 2024-07-25 DIAGNOSIS — L209 Atopic dermatitis, unspecified: Secondary | ICD-10-CM | POA: Diagnosis not present

## 2024-07-25 DIAGNOSIS — Z23 Encounter for immunization: Secondary | ICD-10-CM | POA: Diagnosis not present

## 2024-07-29 DIAGNOSIS — E7800 Pure hypercholesterolemia, unspecified: Secondary | ICD-10-CM | POA: Diagnosis not present

## 2024-07-29 DIAGNOSIS — N951 Menopausal and female climacteric states: Secondary | ICD-10-CM | POA: Diagnosis not present

## 2024-07-29 DIAGNOSIS — R7301 Impaired fasting glucose: Secondary | ICD-10-CM | POA: Diagnosis not present

## 2024-08-21 DIAGNOSIS — R748 Abnormal levels of other serum enzymes: Secondary | ICD-10-CM | POA: Diagnosis not present
